# Patient Record
Sex: Male | Born: 1950
Health system: Southern US, Community
[De-identification: ages and names within clinical notes are randomized; demographics above are authoritative.]

## PROBLEM LIST (undated history)

## (undated) DIAGNOSIS — R0602 Shortness of breath: Secondary | ICD-10-CM

## (undated) DIAGNOSIS — Z8719 Personal history of other diseases of the digestive system: Secondary | ICD-10-CM

## (undated) DIAGNOSIS — I509 Heart failure, unspecified: Secondary | ICD-10-CM

## (undated) HISTORY — DX: Personal history of other diseases of the digestive system: Z87.19

## (undated) HISTORY — PX: OTHER SURGICAL HISTORY: SHX169

## (undated) HISTORY — DX: Shortness of breath: R06.02

## (undated) HISTORY — DX: Heart failure, unspecified: I50.9

---

## 1994-04-21 ENCOUNTER — Encounter: Payer: Self-pay | Admitting: Internal Medicine

## 1999-05-06 ENCOUNTER — Emergency Department (HOSPITAL_COMMUNITY): Admission: EM | Admit: 1999-05-06 | Discharge: 1999-05-06 | Payer: Self-pay | Admitting: Emergency Medicine

## 2000-02-05 ENCOUNTER — Encounter: Payer: Self-pay | Admitting: Internal Medicine

## 2000-02-09 ENCOUNTER — Encounter: Payer: Self-pay | Admitting: Internal Medicine

## 2000-02-12 ENCOUNTER — Encounter: Payer: Self-pay | Admitting: Internal Medicine

## 2000-02-12 ENCOUNTER — Ambulatory Visit (HOSPITAL_COMMUNITY): Admission: RE | Admit: 2000-02-12 | Discharge: 2000-02-12 | Payer: Self-pay | Admitting: Internal Medicine

## 2000-03-08 ENCOUNTER — Encounter: Payer: Self-pay | Admitting: Internal Medicine

## 2000-12-21 ENCOUNTER — Encounter: Payer: Self-pay | Admitting: Emergency Medicine

## 2000-12-21 ENCOUNTER — Emergency Department (HOSPITAL_COMMUNITY): Admission: EM | Admit: 2000-12-21 | Discharge: 2000-12-21 | Payer: Self-pay | Admitting: Emergency Medicine

## 2001-08-26 ENCOUNTER — Encounter (INDEPENDENT_AMBULATORY_CARE_PROVIDER_SITE_OTHER): Payer: Self-pay | Admitting: *Deleted

## 2001-08-26 ENCOUNTER — Inpatient Hospital Stay (HOSPITAL_COMMUNITY): Admission: EM | Admit: 2001-08-26 | Discharge: 2001-08-30 | Payer: Self-pay | Admitting: Emergency Medicine

## 2001-08-26 ENCOUNTER — Encounter (INDEPENDENT_AMBULATORY_CARE_PROVIDER_SITE_OTHER): Payer: Self-pay

## 2001-08-26 ENCOUNTER — Encounter: Payer: Self-pay | Admitting: Internal Medicine

## 2001-09-25 ENCOUNTER — Encounter: Payer: Self-pay | Admitting: Internal Medicine

## 2002-02-08 ENCOUNTER — Emergency Department (HOSPITAL_COMMUNITY): Admission: EM | Admit: 2002-02-08 | Discharge: 2002-02-08 | Payer: Self-pay | Admitting: Emergency Medicine

## 2004-09-12 ENCOUNTER — Emergency Department (HOSPITAL_COMMUNITY): Admission: EM | Admit: 2004-09-12 | Discharge: 2004-09-12 | Payer: Self-pay | Admitting: Emergency Medicine

## 2006-01-03 ENCOUNTER — Emergency Department (HOSPITAL_COMMUNITY): Admission: EM | Admit: 2006-01-03 | Discharge: 2006-01-03 | Payer: Self-pay | Admitting: Emergency Medicine

## 2006-01-10 ENCOUNTER — Emergency Department (HOSPITAL_COMMUNITY): Admission: EM | Admit: 2006-01-10 | Discharge: 2006-01-10 | Payer: Self-pay | Admitting: Emergency Medicine

## 2007-08-13 ENCOUNTER — Inpatient Hospital Stay (HOSPITAL_COMMUNITY): Admission: EM | Admit: 2007-08-13 | Discharge: 2007-08-17 | Payer: Self-pay | Admitting: Emergency Medicine

## 2007-08-14 ENCOUNTER — Encounter: Payer: Self-pay | Admitting: Gastroenterology

## 2007-08-14 HISTORY — PX: ESOPHAGOGASTRODUODENOSCOPY: SHX1529

## 2007-08-17 ENCOUNTER — Encounter (INDEPENDENT_AMBULATORY_CARE_PROVIDER_SITE_OTHER): Payer: Self-pay | Admitting: *Deleted

## 2007-08-17 ENCOUNTER — Ambulatory Visit: Payer: Self-pay | Admitting: Internal Medicine

## 2007-08-24 ENCOUNTER — Ambulatory Visit: Payer: Self-pay | Admitting: Internal Medicine

## 2007-08-24 LAB — CONVERTED CEMR LAB
Basophils Absolute: 0 10*3/uL (ref 0.0–0.1)
Basophils Relative: 0 % (ref 0.0–1.0)
Eosinophils Absolute: 0.2 10*3/uL (ref 0.0–0.6)
Eosinophils Relative: 2.2 % (ref 0.0–5.0)
MCHC: 33.1 g/dL (ref 30.0–36.0)
MCV: 83.4 fL (ref 78.0–100.0)
Neutro Abs: 6.6 10*3/uL (ref 1.4–7.7)
Platelets: 960 10*3/uL — ABNORMAL HIGH (ref 150–400)
RDW: 13.9 % (ref 11.5–14.6)

## 2008-03-14 DIAGNOSIS — D219 Benign neoplasm of connective and other soft tissue, unspecified: Secondary | ICD-10-CM

## 2008-03-14 DIAGNOSIS — K280 Acute gastrojejunal ulcer with hemorrhage: Secondary | ICD-10-CM | POA: Insufficient documentation

## 2008-03-14 DIAGNOSIS — Q8500 Neurofibromatosis, unspecified: Secondary | ICD-10-CM | POA: Insufficient documentation

## 2008-03-14 DIAGNOSIS — D1330 Benign neoplasm of unspecified part of small intestine: Secondary | ICD-10-CM | POA: Insufficient documentation

## 2008-03-14 DIAGNOSIS — K922 Gastrointestinal hemorrhage, unspecified: Secondary | ICD-10-CM | POA: Insufficient documentation

## 2008-03-14 DIAGNOSIS — D509 Iron deficiency anemia, unspecified: Secondary | ICD-10-CM

## 2008-04-11 ENCOUNTER — Encounter: Admission: RE | Admit: 2008-04-11 | Discharge: 2008-04-11 | Payer: Self-pay | Admitting: Internal Medicine

## 2009-06-12 ENCOUNTER — Encounter (INDEPENDENT_AMBULATORY_CARE_PROVIDER_SITE_OTHER): Payer: Self-pay | Admitting: *Deleted

## 2009-06-12 ENCOUNTER — Ambulatory Visit: Payer: Self-pay | Admitting: Sports Medicine

## 2009-06-12 DIAGNOSIS — M67919 Unspecified disorder of synovium and tendon, unspecified shoulder: Secondary | ICD-10-CM | POA: Insufficient documentation

## 2009-06-12 DIAGNOSIS — M719 Bursopathy, unspecified: Secondary | ICD-10-CM

## 2009-06-12 DIAGNOSIS — M7520 Bicipital tendinitis, unspecified shoulder: Secondary | ICD-10-CM

## 2009-06-17 ENCOUNTER — Encounter: Payer: Self-pay | Admitting: Sports Medicine

## 2009-06-26 ENCOUNTER — Encounter: Admission: RE | Admit: 2009-06-26 | Discharge: 2009-08-08 | Payer: Self-pay | Admitting: Sports Medicine

## 2009-07-09 ENCOUNTER — Encounter: Payer: Self-pay | Admitting: Sports Medicine

## 2009-08-13 ENCOUNTER — Encounter: Payer: Self-pay | Admitting: Sports Medicine

## 2010-06-16 ENCOUNTER — Emergency Department (HOSPITAL_COMMUNITY): Admission: EM | Admit: 2010-06-16 | Discharge: 2010-06-16 | Payer: Self-pay | Admitting: Emergency Medicine

## 2010-11-27 ENCOUNTER — Emergency Department (HOSPITAL_COMMUNITY)
Admission: EM | Admit: 2010-11-27 | Discharge: 2010-11-27 | Payer: Self-pay | Source: Home / Self Care | Admitting: Family Medicine

## 2010-11-27 ENCOUNTER — Inpatient Hospital Stay (HOSPITAL_COMMUNITY)
Admission: EM | Admit: 2010-11-27 | Discharge: 2010-12-02 | Payer: Self-pay | Source: Home / Self Care | Attending: Internal Medicine | Admitting: Internal Medicine

## 2010-11-28 ENCOUNTER — Encounter (INDEPENDENT_AMBULATORY_CARE_PROVIDER_SITE_OTHER): Payer: Self-pay | Admitting: Emergency Medicine

## 2011-01-06 ENCOUNTER — Ambulatory Visit: Payer: Self-pay | Admitting: Cardiovascular Disease

## 2011-02-09 ENCOUNTER — Encounter: Payer: Self-pay | Admitting: Gastroenterology

## 2011-02-09 ENCOUNTER — Inpatient Hospital Stay (HOSPITAL_COMMUNITY)
Admission: EM | Admit: 2011-02-09 | Discharge: 2011-02-12 | DRG: 092 | Disposition: A | Discharge status: Home / Self Care | Payer: 59 | Attending: Infectious Disease | Admitting: Infectious Disease

## 2011-02-09 DIAGNOSIS — R933 Abnormal findings on diagnostic imaging of other parts of digestive tract: Secondary | ICD-10-CM

## 2011-02-09 DIAGNOSIS — Z882 Allergy status to sulfonamides status: Secondary | ICD-10-CM

## 2011-02-09 DIAGNOSIS — Q8501 Neurofibromatosis, type 1: Principal | ICD-10-CM | POA: Diagnosis present

## 2011-02-09 DIAGNOSIS — E785 Hyperlipidemia, unspecified: Secondary | ICD-10-CM | POA: Diagnosis present

## 2011-02-09 DIAGNOSIS — K922 Gastrointestinal hemorrhage, unspecified: Secondary | ICD-10-CM

## 2011-02-09 DIAGNOSIS — D473 Essential (hemorrhagic) thrombocythemia: Secondary | ICD-10-CM | POA: Diagnosis present

## 2011-02-09 DIAGNOSIS — D72829 Elevated white blood cell count, unspecified: Secondary | ICD-10-CM | POA: Diagnosis present

## 2011-02-09 DIAGNOSIS — D1330 Benign neoplasm of unspecified part of small intestine: Secondary | ICD-10-CM

## 2011-02-09 DIAGNOSIS — I428 Other cardiomyopathies: Secondary | ICD-10-CM | POA: Diagnosis present

## 2011-02-09 LAB — CARDIAC PANEL(CRET KIN+CKTOT+MB+TROPI)
CK, MB: 1.2 ng/mL (ref 0.3–4.0)
Total CK: 27 U/L (ref 7–232)
Troponin I: 0.02 ng/mL (ref 0.00–0.06)

## 2011-02-09 LAB — CBC
HCT: 35.6 % — ABNORMAL LOW (ref 39.0–52.0)
Hemoglobin: 12.2 g/dL — ABNORMAL LOW (ref 13.0–17.0)
MCH: 28.2 pg (ref 26.0–34.0)
MCHC: 34.3 g/dL (ref 30.0–36.0)
MCHC: 33.2 g/dL (ref 30.0–36.0)
Platelets: 451 10*3/uL — ABNORMAL HIGH (ref 150–400)
Platelets: 427 10*3/uL — ABNORMAL HIGH (ref 150–400)
RDW: 15.1 % (ref 11.5–15.5)

## 2011-02-09 LAB — COMPREHENSIVE METABOLIC PANEL
AST: 29 U/L (ref 0–37)
Calcium: 8.6 mg/dL (ref 8.4–10.5)
Chloride: 111 mEq/L (ref 96–112)
GFR calc Af Amer: >60 mL/min (ref >60)
Sodium: 142 mEq/L (ref 135–145)
Total Bilirubin: 0.7 mg/dL (ref 0.3–1.2)

## 2011-02-09 LAB — TYPE AND SCREEN: ABO/RH(D): A POS

## 2011-02-09 LAB — POCT CARDIAC MARKERS: Myoglobin, poc: 32.2 ng/mL (ref 12–200)

## 2011-02-09 LAB — PROTIME-INR: INR: 0.95 (ref 0.00–1.49)

## 2011-02-09 LAB — DIFFERENTIAL
Basophils Relative: 1 % (ref 0–1)
Eosinophils Relative: 1 % (ref 0–5)
Lymphocytes Relative: 18 % (ref 12–46)
Monocytes Relative: 12 % (ref 3–12)
Neutro Abs: 10.3 10*3/uL — ABNORMAL HIGH (ref 1.7–7.7)

## 2011-02-09 LAB — MRSA PCR SCREENING: MRSA by PCR: NEGATIVE

## 2011-02-09 LAB — TROPONIN I: Troponin I: 0.04 ng/mL (ref 0.00–0.06)

## 2011-02-10 DIAGNOSIS — R933 Abnormal findings on diagnostic imaging of other parts of digestive tract: Secondary | ICD-10-CM

## 2011-02-10 DIAGNOSIS — K921 Melena: Secondary | ICD-10-CM

## 2011-02-10 DIAGNOSIS — D1330 Benign neoplasm of unspecified part of small intestine: Secondary | ICD-10-CM

## 2011-02-10 LAB — COMPREHENSIVE METABOLIC PANEL
Albumin: 3.3 g/dL — ABNORMAL LOW (ref 3.5–5.2)
BUN: 16 mg/dL (ref 6–23)
Calcium: 8.4 mg/dL (ref 8.4–10.5)
Creatinine, Ser: 0.8 mg/dL (ref 0.4–1.5)
GFR calc Af Amer: >60 mL/min (ref >60)
Sodium: 141 mEq/L (ref 135–145)
Total Bilirubin: 0.9 mg/dL (ref 0.3–1.2)
Total Protein: 5.8 g/dL — ABNORMAL LOW (ref 6.0–8.3)

## 2011-02-10 LAB — CBC
HCT: 31.6 % — ABNORMAL LOW (ref 39.0–52.0)
HCT: 33 % — ABNORMAL LOW (ref 39.0–52.0)
Hemoglobin: 10.5 g/dL — ABNORMAL LOW (ref 13.0–17.0)
MCH: 27.1 pg (ref 26.0–34.0)
Platelets: 421 10*3/uL — ABNORMAL HIGH (ref 150–400)
RBC: 3.81 MIL/uL — ABNORMAL LOW (ref 4.22–5.81)
RBC: 4.02 MIL/uL — ABNORMAL LOW (ref 4.22–5.81)
WBC: 21.1 10*3/uL — ABNORMAL HIGH (ref 4.0–10.5)
WBC: 21.3 10*3/uL — ABNORMAL HIGH (ref 4.0–10.5)

## 2011-02-11 ENCOUNTER — Encounter (INDEPENDENT_AMBULATORY_CARE_PROVIDER_SITE_OTHER): Payer: Self-pay

## 2011-02-11 ENCOUNTER — Encounter: Payer: Self-pay | Admitting: Internal Medicine

## 2011-02-11 DIAGNOSIS — D1330 Benign neoplasm of unspecified part of small intestine: Secondary | ICD-10-CM

## 2011-02-11 DIAGNOSIS — R933 Abnormal findings on diagnostic imaging of other parts of digestive tract: Secondary | ICD-10-CM

## 2011-02-11 DIAGNOSIS — K921 Melena: Secondary | ICD-10-CM

## 2011-02-11 LAB — CBC
Hemoglobin: 10.4 g/dL — ABNORMAL LOW (ref 13.0–17.0)
MCH: 27.9 pg (ref 26.0–34.0)
MCHC: 34 g/dL (ref 30.0–36.0)
RDW: 14.8 % (ref 11.5–15.5)

## 2011-02-12 DIAGNOSIS — K922 Gastrointestinal hemorrhage, unspecified: Secondary | ICD-10-CM

## 2011-02-12 LAB — CBC
MCH: 27.9 pg (ref 26.0–34.0)
RDW: 14.5 % (ref 11.5–15.5)

## 2011-02-16 NOTE — Procedures (Signed)
Summary: Upper Endoscopy  Patient: [name] Note: All result statuses are Final unless otherwise noted.  Tests: (1) Upper Endoscopy (EGD)   EGD Upper Endoscopy       DONE     [facility]     [address]     [place], [place]  [address]           ENDOSCOPY PROCEDURE REPORT           PATIENT:  Mike [name], Mike [name]  MR#:  [id number]     BIRTHDATE:  Dec 06, 1951, 59 yrs. 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  The EG-2990i ([id number]) endoscope was     introduced through the mouth and advanced to the third portion of     the duodenum, without limitations.  The instrument was slowly     withdrawn as the mucosa was fully examined.     <<PROCEDUREIMAGES>>     There were four submucosal nodules in duodenum. These ranged in     size from 1-2.5cm. The largest, located in second duodenum was     ulcerated and oozing blood slowly. This was treated with injection     of 5 cc of dilute epinephrine and then bicap cautery. After     treatment, the base of the nodule was "tatooed" with injectin of     [place] Ink (see image006, image007, image010, and image011).     Otherwise the examination was normal (see image002 and image001).     Retroflexed views revealed no abnormalities.    The scope was then     withdrawn from the patient and the procedure completed.     COMPLICATIONS:  None           ENDOSCOPIC IMPRESSION:     1) Four submucosal nodules in duodenum, the largest was 2.5cm     (ulcerated  and oozing) and it was treated with injection of dilute     epinephrine and cautery.  This lesion was similar to that noted by     Dr. Russella Dar in 2008, but larger.     2) Otherwise normal examination           RECOMMENDATIONS:     Follow clinically for rebleeding. 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 There were 4     of these submucosal lesions in duodenum and could be many more in     more distal small bowel.     If bleeding continues, he may require surgical resection (site     of largest nodule was labled with [place] Ink).           ______________________________     Rachael Fee, MD  n.     eSIGNED:   Rachael Fee at 02/09/2011 02:48 PM           Karna Dupes, [id number]  Note: An exclamation mark (!) indicates a result that was not dispersed into the flowsheet. Document Creation Date: 02/09/2011 2:49 PM _______________________________________________________________________  (1) Order result status: Final Collection or observation date-time: 02/09/2011 14:36 Requested date-time:  Receipt date-time:  Reported date-time:  Referring Physician:   Ordering Physician: Rob Bunting [phone]) Specimen Source:  Source: Launa Grill Order Number: [id number] Lab site:

## 2011-02-16 NOTE — Letter (Signed)
Summary: Appt Reminder 2  [facility]  [address]   [place], [place] [address]   Phone: [phone]  Fax: [phone]        February 11, 2011 MRN: [id number]    KAYNEN MINNER [address] [place], [place]  [address]    Dear Mr. Margaretmary Dys,   You have a return appointment with Dr. Marina Goodell on 02/23/11 at 2pm.  Please remember to bring a complete list of the medicines you are taking, your insurance card and your co-pay.  If you have to cancel or reschedule this appointment, please call before 5:00 pm the evening before to avoid a cancellation fee.  If you have any questions or concerns, please call [phone].    Sincerely,    Selinda Michaels RN  Appended Document: Appt Reminder 2 Letter is mailed to the patient's home address

## 2011-02-16 NOTE — Miscellaneous (Signed)
Summary: F/U appt  Clinical Lists Changes Patient scheduled for hospital f/u for 02/23/11@2pm  with Dr. Marina Goodell. Spoke with patient nurse at the hospital. She will give him the appointment date and time.

## 2011-02-23 ENCOUNTER — Ambulatory Visit (INDEPENDENT_AMBULATORY_CARE_PROVIDER_SITE_OTHER): Payer: Commercial Managed Care - PPO | Admitting: Internal Medicine

## 2011-02-23 ENCOUNTER — Encounter: Payer: Self-pay | Admitting: Internal Medicine

## 2011-02-23 DIAGNOSIS — D1330 Benign neoplasm of unspecified part of small intestine: Secondary | ICD-10-CM

## 2011-02-23 DIAGNOSIS — D62 Acute posthemorrhagic anemia: Secondary | ICD-10-CM

## 2011-02-23 DIAGNOSIS — K921 Melena: Secondary | ICD-10-CM

## 2011-02-25 NOTE — Discharge Summary (Signed)
Summary: Discharge Summary  NAME:  RIGBY, LEONHARDT NO.:  [id number]      MEDICAL RECORD NO.:  [id number]          PATIENT TYPE:  INP      LOCATION:  5004                         FACILITY:  [facility]      PHYSICIAN:  Judie Petit T. Russella Dar, MD, FACGDATE OF BIRTH:  [birth year]      DATE OF ADMISSION:  08/13/2007   DATE OF DISCHARGE:  08/17/2007                                  DISCHARGE SUMMARY      ADMITTING DIAGNOSES:   88. A 60 year old white male with acute gastrointestinal bleed in the       setting of known neurofibromas of the small bowel, suspect small       bowel source for current bleed.   2. 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   3. Status post remote splenectomy, traumatic.   4. Neurofibromatosis.      DISCHARGE DIAGNOSES:   1. Stable, status post acute upper gastrointestinal bleed secondary to       ulcerated duodenal neurofibroma.   2. Anemia, acute, blood loss secondary to above.   3. Other diagnoses as listed above.      CONSULTATIONS:  None.      PROCEDURES:  Upper endoscopy per Dr. Russella Dar on August 14, 2007 with   epinephrine and bipolar electrocoagulation therapy.      BRIEF HISTORY:  Mr. Skibinski is a very nice 60 year old white male known   to [facility], with history of neurofibromatosis.  He has a prior   history of GI bleeding remotely in 1986 and then had an episode for   which he was hospitalized approximately 6 years ago.  At that time, he   had source identified in the duodenum felt secondary to a 2-cm   neurofibroma.  His bleeding stopped without any intervention and he has   not had any bleeding in interval years.  At this time, he presents to   the emergency room after onset the morning of admission with black   stools at home x1.  He has no associated abdominal pain, no weakness,   dizziness, nausea, vomiting, etc.  He denies any regular aspirin or   NSAID use, no new medications.  He was  hemodynamically stable in the   emergency room, but did have melena on rectal exam and a hemoglobin of   10.8.  He was admitted for supportive medical management.      LABORATORY STUDIES:  Again on admission, WBC of 11.6, hemoglobin 10.8,   hematocrit of 32.7, MCV of 86.  Serial values were obtained.  On the   24th, hemoglobin dropped to 9.6, hematocrit of 28; on the 26th, stable   at 9.7 and 28.6 and on the 28th, hemoglobin 9.7 and hematocrit of 28.6.   Pro time 12.2, INR of 0.9.  Electrolytes within normal limits.  BUN was   14, creatinine 0.6 on admission.  Albumin 3.4.  Liver function studies   normal.  H. pylori antibody was done and is still pending.  HOSPITAL COURSE:  The patient was admitted to the service of Dr. Stan Head, who was covering on-call.  He was placed on IV fluids and   b.i.d. IV Protonix.  Serial H&H were obtained.  Fortunately, he had a   very stable course, did not require any blood transfusions.  He   underwent upper endoscopy with Dr. Russella Dar on the morning of 25th and was   found to have an ulceration on the surface of a neurofibroma in the   second portion of duodenum; this was oozing.  It was injected with   epinephrine and then cauterized.  He had 2 other neurofibromas noted in   that same region of the duodenum.  The ulceration on was located on the   distal aspect of the neurofibroma and was visualized and treated with   the duodenoscope as it was not visualized with the standard forward   viewing endoscope. He was placed on an IV infusion of Protonix.  His   diet was gradually advanced.  He did not have any further evidence of   active bleeding.  His hemoglobin remained stable in the 9.7 range and on   the 28th, he was allowed discharge to home with instructions to follow   up with Dr. Marina Goodell in the office on September 4 and at that time, will   need followup H&H.  He also will be discharged on b.i.d. Protonix for 2   weeks and then daily  thereafter, was advised again no aspirin or NSAIDs.   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               Amy Esterwood, PA-C               Malcolm T. Russella Dar, MD, [facility]   Electronically Signed         AE/MEDQ  D:  08/17/2007  T:  08/18/2007  Job:  [id number]

## 2011-02-25 NOTE — Discharge Summary (Signed)
Summary: discharge summary                    [place]  Patient:    Mike [name], Mike [name] Visit Number: [id number] MRN: [id number]          Service Type: MED Location: 5000 [id number] Attending Physician:  Mervin Hack Dictated by:   Dianah Field, P.A. Admit Date:  08/26/2001 Disc. Date: 08/30/01   CC:         [name]. [name]., M.D. [facility] T. Hoxworth, M.D.   Discharge Summary  ADMISSION DIAGNOSES: 1. Upper gastrointestinal bleed secondary to small bowel neurofibroma.    Possibly precipitated by aspirin use. 2. Anemia, acute.  Possibly chronic as well. 3. History of iron-deficiency anemia, on chronic supplementation. 4. Neurofibromatosis with upper intestinal bleeds in the 1980s, 1995, and    heme-positive anemia in February 2001. 5. 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 6. SULFA allergy.  DISCHARGE DIAGNOSES: 1. 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 2. Status post EGD with injection of epinephrine per Dr. Lina Sar on    August 26, 2001. 3. Acute on chronic anemia.  Chronic anemia despite iron supplementation.    Acute anemia secondary to the acute gastrointestinal blood losses.  The    patient was transfused with 2 units of packed red blood cells during this    admission. 4. Neurofibromatosis. 5. Mild cardiomegaly by chest x-ray, no evidence for hypertension during this    admission but needs outpatient follow-up to rule this out.  CONSULTATIONS:  Dr. Glenna Fellows, general surgery.  PROCEDURES:  Upper endoscopy by Dr. Juanda Chance with findings of duodenal mass felt to be neurofibroma containing an ulcerated bleeding area.  BRIEF HISTORY:  Mr. Mike [name] is a pleasant 60 year old white male.  Generally, he has no health problems other than a history of neurofibromatosis and GI bleeds associated with duodenal neurofibromas.  He had had a GI bleed in the 1980s when he was in the  service in [place].  In 1995, he was hospitalized by Dr. Marina Goodell for upper GI bleed and at this time he had an upper endoscopy, colonoscopy, and a small bowel enteroclysis.  What Dr. Marina Goodell found on the endoscopy was multiple neurofibromas within the duodenum.  Although these were not bleeding at the time, they were felt to be the source for the GI bleed. Again, in February 2001, he was seen at Dr. Broadus John office because of anemia and Hemoccult positive stool but no overt bleeding.  Endoscopy and colonoscopy were repeated at that time, and again the colonoscopy to the terminal ileum was normal and upper endoscopy including push enteroscopy showed a large submucosal mass in the third portion of the duodenum, which measured at least 4 cm, and a small, approximately 8 mm, submucosal mass in the second duodenum. There was superficial ulceration of this larger mass.  He did reach the proximal jejunum without seeing any further neurofibromas.  Incidentally, he found esophagitis and a large caliber benign-appearing stricture and a sliding hiatal hernia.  Since February 2001, the patient has done pretty well and his hemoglobin in March 2001 was 15.7.  He had been started on iron supplementation.  The patient presented to the emergency room at [facility] on September 7 with a history of one melanic black stool on the morning of admission and a darker but nonmelanic stool on the day prior.  He had some associated weakness and sweating with  a bowel movement but no abdominal pain, nausea, vomiting, or hematemesis.  He did say that, because of some achy soreness, he had been using Aleve over the past several days but had not been using any aspirin.  He was not aware that Aleve could exacerbate or trigger gastrointestinal bleeding.  In the emergency room, his hemoglobin was 10.2. Blood pressure and pulse were stable and he was in no acute distress.  He was admitted by Dr. Lina Sar and underwent upper  endoscopy on the day of admission.  Dr. Juanda Chance did not have access to the patients records and she thought that what she saw was a neurofibroma, within which was a bleeding ulcerated area, but she was not certain whether this might not be an enlarged biliary papula; however, on review of the patients office records, it was fairly clear that this was again a neurofibroma that was bleeding.  LABORATORY DATA:  Hemoglobin on admission was 10.2.  Later, on hospital day #1, it was 8.2.  At discharge, hemoglobin was 8.5.  Hematocrit initially was 30, down to 24.1, and final assay was 25.  White blood cell count up to a maximum of 17.7.  It was 13.9 near discharge.  MCV was 82.8.  Platelets ranging from 272 up to 449.  PT was 13.2, INR 1.0, PTT 26.  IV bleeding time was elevated at 13.5 minutes, the reference range is 2-8.5 minutes.  Sodium 136, potassium 4.3, chloride 110.  Glucose was anywhere from 113 up to 121. BUN was initially elevated at 35 but normalized to 13.  Creatinine was 0.8. Calcium slightly low at 7.8.  Albumin 3.3.  Total bilirubin 0.4, alkaline phosphatase 53, AST 17, ALT 18.  Iron was 36, total iron binding capacity 312, iron saturation was 12, and ferritin was 11.  Urinalysis was negative.  September 7 acute abdominal series with PA chest views showed some mild cardiomegaly.  There were multiple minimally prominent loops of small bowel representing a nonspecific bowel gas pattern.  HOSPITAL COURSE:  The patient underwent upper endoscopy by Dr. Juanda Chance.  He was admitted initially to an ICU bed.  By September 8, hospital day #2, he had received 2 units of packed red blood cells.  Hemodynamically, he had been stable overnight.  The elevated BUN was normalizing.  He had had no further stools.  He was transferred out of ICU to a nonmonitored bed on unit 5000.  Over the next few days of his hospitalization, the patients blood counts were tracked and these dropped further down to a  low of hemoglobin of 8 but  decision was made not to transfuse him any further, as he was not having any symptomatic repercussions from the anemia, such as dizziness.  He was a bit tired but was not profoundly weak.  His blood pressure was well controlled. At times, he got mildly tachycardic into the low 100s.  Because of the recurrent nature of the patients GI bleeds, Dr. Johna Sheriff was called in to see the patient.  Dr. Johna Sheriff has seen this patient a few years back.  His opinion was to watch the patient off the nonsteroidals.  He did feel that, if the patient had recurrent bleeding, that surgery would probably be the patients best option but for this hospitalization, if he did not have any further bleeding, that surgery was unnecessary.  It turns out that the patient did not have any further acute bleeding.  He passed some small, dark bowel movements but had no large-volume  melena. Hemoglobin stabilized and the patient clinically was able to walk down to the cafeteria without difficulty.  He was felt stable and improved enough for discharge on September 11.  FOLLOW-UP:  He is to see Dr. Marina Goodell in the office within the next 2-4 weeks. At that time, his blood counts can be rechecked.  In the meantime, the patient is able to get the emergency room staff to check his blood counts if he wishes or feels necessary.  He is to call Dr. Broadus John office if he has any further bleeding or symptomatic anemia such as tachycardia, weakness, nausea, or recurrent melena.  DISCHARGE MEDICATIONS:  Iron sulfate 325 mg 1 p.o. t.i.d.  He was again advised not to use any aspirin, Aleve, ibuprofen, etc.  If he had any questions about whether or not a medication could cause GI bleeding, he was to consult a pharmacist or physician.  He was also advised to use stool softener or Milk of Magnesia if needed for iron-related constipation.  RETURN TO WORK:  The patient was okay to return to work on September 23.   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  DIET AT DISCHARGE:  Unrestricted.  The patient does not have a primary care physician and he was advised to establish care with a primary care physician of his choice.  He needs regular medical management.  The chest x-ray had shown some cardiomegaly, which raises question of whether or not the patient has some underlying hypertension and he should have regular general medical care. Dictated by:   Dianah Field, P.A. Attending Physician:  Mervin Hack DD:  08/30/01 TD:  08/30/01 Job: [id number]

## 2011-02-25 NOTE — Procedures (Signed)
Summary: EGD-Dr. Juanda Chance   EGD  Procedure date:  08/26/2001  Findings:      Location: [facility]  Findings: Ulcer, bleeding  duodenal mass                    [place]  Patient:    Mike [name], Mike [name] Visit Number: [id number] MRN: [id number]          Service Type: MED Location: MICU 2115 01 Attending Physician:  Mervin Hack Dictated by:   Hedwig Morton. Juanda Chance, M.D. [facility] Proc. Date: 08/26/01 Admit Date:  08/26/2001   CC:         [name]. [name]., M.D. [facility]   Procedure Report  PROCEDURE: Upper endoscopy.  ENDOSCOPIST: Hedwig Morton. Juanda Chance, M.D.  INDICATIONS FOR PROCEDURE: This is a 60 year old gentleman, with neurofibromatosis and chronic intermittent GI bleed, who presented with melenic stools of 24 hours duration.  Hemoglobin 10.2 g while taking iron. His hemoglobin was 13.5 within the last four weeks.  He denies any abdominal pain.  He has been on Aleve two tablets q.d.  He had previous endoscopy and colonoscopy on three previous occasions, the last one approximately two years ago.  His BUN was elevated at 35.  His stool was melenic and Hemoccult positive.  He is undergoing upper endoscopy to assess him for upper GI bleed. Actually the pediatric colonoscopy was used for enteroscopy in expectation of looking for AV malformations.  ENDOSCOPE: Olympus single-channel pediatric colonoscope.  SEDATION: Versed 10 mg IV, Demerol 100 mg IV.  FINDINGS: The Fujinon single-channel video endoscope was passed under direct vision through the posterior pharynx and esophagus.  The patient was monitored by pulse oximetry and oxygen saturations were normal.  Proximal and distal esophageal mucosa was unremarkable.  There was a small hiatal hernia which was easily reducible, and normal Z-line.  There was no blood in the stomach or esophagus.  Stomach - The stomach was insufflated with air and showed normal pyloric outlet, gastric folds, and lesser curvature  of the stomach.  A few coffee-ground specks were refluxing from the duodenum.  Duodenum - The duodenal bulb was normal.  Papilla was visualized in the second portion of the duodenum.  Distal to the papilla about 3 cm distally was a large polypoid mass which was smooth with large ulceration measuring at least 1 cm in diameter, with an ulcerative center which was oozing low-grade bleed. No arterial spurt.  It was not clear whether this represented neurofibroma or whether this was actually a major papilla which was enlarged.  The descending duodenum and through the ligament of Treitz and distally was entirely normal. The endoscope was then retracted and the bleeding lesion injected with 1 cc of 1:200,000 epinephrine, which seemed to increase the bleeding for about a minute and the bleeding then stopped again.  Because of the uncertainty as to whether this represented major papilla or a neurofibroma no biopsies or further injections were carried out.  The patient tolerated the procedure well.  IMPRESSION: Duodenal mass with bleeding ulcer, ulcerated area; rule out neurofibroma versus enlarged biliary papilla.  PLAN: The patient will be observed in the intensive care unit and transfused as needed.  We will review his old records as to the results of previous endoscopy and findings of the mass, which will have to be further evaluated when the patient stops bleeding.  We will likely proceed with CT scan of the abdomen, liver function tests, and possible biopsy of  the lesion. Dictated by:   Hedwig Morton. Juanda Chance, M.D. [facility] Attending Physician:  Mervin Hack DD:  08/26/01 TD:  08/27/01 Job: [id number]  This report was created from the original endoscopy report, which was reviewed and signed by the above listed endoscopist.   cc:  Yancey Flemings, MD

## 2011-03-02 LAB — COMPREHENSIVE METABOLIC PANEL
ALT: 29 U/L (ref 0–53)
AST: 15 U/L (ref 0–37)
Albumin: 3.2 g/dL — ABNORMAL LOW (ref 3.5–5.2)
Albumin: 3.3 g/dL — ABNORMAL LOW (ref 3.5–5.2)
Albumin: 3.5 g/dL (ref 3.5–5.2)
Alkaline Phosphatase: 72 U/L (ref 39–117)
BUN: 11 mg/dL (ref 6–23)
BUN: 12 mg/dL (ref 6–23)
Calcium: 8.5 mg/dL (ref 8.4–10.5)
Chloride: 104 mEq/L (ref 96–112)
Chloride: 106 mEq/L (ref 96–112)
Creatinine, Ser: 0.96 mg/dL (ref 0.4–1.5)
Creatinine, Ser: 1.02 mg/dL (ref 0.4–1.5)
GFR calc Af Amer: >60 mL/min (ref >60)
Glucose, Bld: 105 mg/dL — ABNORMAL HIGH (ref 70–99)
Potassium: 3.6 mEq/L (ref 3.5–5.1)
Sodium: 138 mEq/L (ref 135–145)
Total Bilirubin: 0.8 mg/dL (ref 0.3–1.2)
Total Bilirubin: 1.3 mg/dL — ABNORMAL HIGH (ref 0.3–1.2)
Total Protein: 6.5 g/dL (ref 6.0–8.3)
Total Protein: 6.6 g/dL (ref 6.0–8.3)

## 2011-03-02 LAB — URINALYSIS, ROUTINE W REFLEX MICROSCOPIC
Bilirubin Urine: NEGATIVE
Nitrite: NEGATIVE

## 2011-03-02 LAB — POCT I-STAT 3, ART BLOOD GAS (G3+)
Acid-base deficit: 2 mmol/L (ref 0.0–2.0)
pO2, Arterial: 55 mmHg — ABNORMAL LOW (ref 80.0–100.0)

## 2011-03-02 LAB — CARDIAC PANEL(CRET KIN+CKTOT+MB+TROPI)
Relative Index: INVALID (ref 0.0–2.5)
Relative Index: INVALID (ref 0.0–2.5)
Relative Index: INVALID (ref 0.0–2.5)
Relative Index: INVALID (ref 0.0–2.5)
Total CK: 38 U/L (ref 7–232)
Total CK: 43 U/L (ref 7–232)
Troponin I: 0.02 ng/mL (ref 0.00–0.06)
Troponin I: 0.02 ng/mL (ref 0.00–0.06)
Troponin I: 0.02 ng/mL (ref 0.00–0.06)

## 2011-03-02 LAB — LIPID PANEL
Cholesterol: 212 mg/dL — ABNORMAL HIGH (ref 0–200)
Total CHOL/HDL Ratio: 3.2 RATIO
VLDL: 8 mg/dL (ref 0–40)

## 2011-03-02 LAB — URINE MICROSCOPIC-ADD ON

## 2011-03-02 LAB — POCT I-STAT 3, VENOUS BLOOD GAS (G3P V)
Acid-base deficit: 2 mmol/L (ref 0.0–2.0)
O2 Saturation: 58 %
TCO2: 24 mmol/L (ref 0–100)
pH, Ven: 7.362 — ABNORMAL HIGH (ref 7.250–7.300)

## 2011-03-02 LAB — CBC
HCT: 42.6 % (ref 39.0–52.0)
HCT: 44 % (ref 39.0–52.0)
HCT: 45.4 % (ref 39.0–52.0)
Hemoglobin: 14.3 g/dL (ref 13.0–17.0)
Hemoglobin: 14.8 g/dL (ref 13.0–17.0)
MCH: 27.5 pg (ref 26.0–34.0)
MCH: 28.1 pg (ref 26.0–34.0)
MCH: 28.2 pg (ref 26.0–34.0)
MCHC: 33.5 g/dL (ref 30.0–36.0)
MCHC: 33.8 g/dL (ref 30.0–36.0)
MCHC: 34.1 g/dL (ref 30.0–36.0)
MCV: 83.4 fL (ref 78.0–100.0)
MCV: 83.4 fL (ref 78.0–100.0)
MCV: 84.5 fL (ref 78.0–100.0)
Platelets: 472 10*3/uL — ABNORMAL HIGH (ref 150–400)
Platelets: 479 10*3/uL — ABNORMAL HIGH (ref 150–400)
Platelets: 507 10*3/uL — ABNORMAL HIGH (ref 150–400)
RBC: 5.24 MIL/uL (ref 4.22–5.81)
RBC: 5.38 MIL/uL (ref 4.22–5.81)
RDW: 13.7 % (ref 11.5–15.5)
RDW: 13.7 % (ref 11.5–15.5)
RDW: 13.8 % (ref 11.5–15.5)
RDW: 13.9 % (ref 11.5–15.5)
RDW: 13.9 % (ref 11.5–15.5)
WBC: 11 10*3/uL — ABNORMAL HIGH (ref 4.0–10.5)
WBC: 12.5 10*3/uL — ABNORMAL HIGH (ref 4.0–10.5)
WBC: 9.2 10*3/uL (ref 4.0–10.5)

## 2011-03-02 LAB — BASIC METABOLIC PANEL
BUN: 13 mg/dL (ref 6–23)
CO2: 23 mEq/L (ref 19–32)
Calcium: 9 mg/dL (ref 8.4–10.5)
Chloride: 107 mEq/L (ref 96–112)
Chloride: 108 mEq/L (ref 96–112)
Creatinine, Ser: 0.89 mg/dL (ref 0.4–1.5)
GFR calc Af Amer: >60 mL/min (ref >60)
Glucose, Bld: 102 mg/dL — ABNORMAL HIGH (ref 70–99)
Potassium: 3.7 mEq/L (ref 3.5–5.1)
Sodium: 139 mEq/L (ref 135–145)
Sodium: 140 mEq/L (ref 135–145)

## 2011-03-02 LAB — DIFFERENTIAL
Basophils Absolute: 0.1 10*3/uL (ref 0.0–0.1)
Eosinophils Relative: 4 % (ref 0–5)
Lymphocytes Relative: 27 % (ref 12–46)
Lymphocytes Relative: 33 % (ref 12–46)
Lymphs Abs: 3 10*3/uL (ref 0.7–4.0)
Monocytes Absolute: 1.7 10*3/uL — ABNORMAL HIGH (ref 0.1–1.0)
Monocytes Relative: 14 % — ABNORMAL HIGH (ref 3–12)
Monocytes Relative: 19 % — ABNORMAL HIGH (ref 3–12)
Neutrophils Relative %: 57 % (ref 43–77)

## 2011-03-02 LAB — D-DIMER, QUANTITATIVE: D-Dimer, Quant: <0.22 ug/mL-FEU (ref 0.00–0.48)

## 2011-03-02 LAB — HIV ANTIBODY (ROUTINE TESTING W REFLEX): HIV: NONREACTIVE

## 2011-03-02 LAB — IRON: Iron: 78 ug/dL (ref 42–135)

## 2011-03-02 LAB — HEPATITIS PANEL, ACUTE
Hep B C IgM: NEGATIVE
Hepatitis B Surface Ag: NEGATIVE

## 2011-03-02 LAB — IRON AND TIBC
Iron: 51 ug/dL (ref 42–135)
UIBC: 272 ug/dL

## 2011-03-02 LAB — POCT CARDIAC MARKERS
CKMB, poc: 1.2 ng/mL (ref 1.0–8.0)
Myoglobin, poc: 61.3 ng/mL (ref 12–200)
Troponin i, poc: 0.07 ng/mL (ref 0.00–0.09)

## 2011-03-02 LAB — TROPONIN I: Troponin I: 0.03 ng/mL (ref 0.00–0.06)

## 2011-03-02 LAB — PROTIME-INR
INR: 0.88 (ref 0.00–1.49)
Prothrombin Time: 12.1 seconds (ref 11.6–15.2)

## 2011-03-02 LAB — APTT: aPTT: 23 seconds — ABNORMAL LOW (ref 24–37)

## 2011-03-02 LAB — CK TOTAL AND CKMB (NOT AT ARMC): Relative Index: INVALID (ref 0.0–2.5)

## 2011-03-02 LAB — FERRITIN: Ferritin: 66 ng/mL (ref 22–322)

## 2011-03-02 NOTE — Procedures (Signed)
Summary: EGD/[facility]  EGD/[facility]   Imported By: Sherian Rein 02/24/2011 08:31:24  _____________________________________________________________________  External Attachment:    Type:   Image     Comment:   External Document

## 2011-03-02 NOTE — Letter (Signed)
Summary: Return to Work  [facility]  [address] [place], [place] [address]   Phone: [phone]  Fax: [phone]           02/23/2011  TO: Leodis Sias IT MAY CONCERN  RE: Mike [name] [address] [place]   The above named individual is under my medical care and may return to work on: Monday, 03/01/11    If you have any further questions or need additional information, please call.     Sincerely,    [name]. Marina Goodell, MD

## 2011-03-02 NOTE — Procedures (Signed)
Summary: EGD/[facility]  EGD/[facility]   Imported By: Sherian Rein 02/24/2011 08:29:21  _____________________________________________________________________  External Attachment:    Type:   Image     Comment:   External Document

## 2011-03-02 NOTE — Assessment & Plan Note (Signed)
Summary: F/U Hospital per Dr. Marina Goodell -  GI bleed   History of Present Illness Visit Type: Follow-up Visit Primary GI MD: Yancey Flemings MD Primary Provider: n/a Chief Complaint: Patient here for f/u after hospitalization. Endooscopy showed ulcerated, oozing nodules in duodenum. History of Present Illness:    60 year old with hypertension, hyperlipidemia, and neurofibromatosis. history of occult GI bleeding with iron deficiency anemia as well as acute GI bleeding. Hospitalized in  1995 , 2008, and most recently 2 weeks ago with acute GI bleeding secondary to ulceration of a submucosal duodenal mass. 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 required endoscopic hemostatic therapy the last 2 sessions. Previously seen by surgery for possible resection. Most recent lesion marked with [place] ink tattoo. Most recent lesion noted to be larger than on previous endoscopy. since discharge from hospital has done well without recurrent bleeding. Aside for some fatigue otherwise okay. request out of work until Monday.   GI Review of Systems      Denies abdominal pain, belching, bloating, chest pain, dysphagia with liquids, dysphagia with solids, heartburn, loss of appetite, nausea, vomiting, vomiting blood, weight loss, and  weight gain.        Denies anal fissure, black tarry stools, change in bowel habit, constipation, diarrhea, diverticulosis, fecal incontinence, heme positive stool, hemorrhoids, irritable bowel syndrome, jaundice, light color stool, liver problems, rectal bleeding, and  rectal pain. Preventive Screening-Counseling & Management  Alcohol-Tobacco     Smoking Status: never  Caffeine-Diet-Exercise     Does Patient Exercise: yes      Drug Use:  no.      Current Medications (verified): 1)  Protonix 40 Mg Tbec (Pantoprazole Sodium) .... Take 1 Tablet By Mouth Two Times A Day 2)  Ferrous Gluconate 324 (38 Fe) Mg Tabs (Ferrous Gluconate) .... Take 1 Tablet By Mouth Three Times A Day 3)   Carvedilol 6.25 Mg Tabs (Carvedilol) .... Take 1 Tablet By Mouth Two Times A Day 4)  Diovan 160 Mg Tabs (Valsartan) .... Take 1 Tablet By Mouth Once A Day 5)  Furosemide 20 Mg Tabs (Furosemide) .... Take 1 Tablet By Mouth Once A Day 6)  Lipitor 20 Mg Tabs (Atorvastatin Calcium) .... Take 1 Tablet By Mouth Once A Day  Allergies (verified): 1)  ! Sulfa  Past History:  Past Medical History: Reviewed history from 02/19/2011 and no changes required. 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  Alcohol Use - no Daily Caffeine Use-4 cups daily Illicit Drug Use - no Patient gets regular exercise. Smoking Status:  never Drug Use:  no Does Patient Exercise:  yes  Review of Systems       The patient complains of hearing problems.  The patient denies allergy/sinus, anemia, anxiety-new, arthritis/joint pain, back pain, blood in urine, breast changes/lumps, change in vision, confusion, cough, coughing up blood, depression-new, fainting, fatigue, fever, headaches-new, heart murmur, heart rhythm changes, itching, menstrual pain, muscle pains/cramps, night sweats, nosebleeds, pregnancy symptoms, shortness of breath, skin rash, sleeping problems, sore throat, swelling of feet/legs, swollen lymph glands, thirst - excessive , urination - excessive , urination changes/pain, urine leakage, vision changes, and voice change.    Vital  Signs:  Patient profile:   60 year old male Height:      72 inches Weight:      199.50 pounds BMI:      27.15 BSA:     2.13 Pulse rate:   80 / minute Pulse rhythm:   regular BP sitting:   112 / 80  (left arm)  Vitals Entered By: Lamona Curl CMA Duncan Dull) (February 23, 2011 2:05 PM)  Physical Exam  General:  Well developed, well nourished, no acute distress. Head:  Normocephalic and atraumatic. Eyes:  PERRLA, no icterus. Mouth:  No deformity or lesions, dentition normal. Neck:  Supple; no masses or thyromegaly. Lungs:  Clear throughout to auscultation. Heart:  Regular rate and rhythm; no murmurs, rubs,  or bruits. Abdomen:  Soft, nontender and nondistended. No masses, hepatosplenomegaly or hernias noted. Normal bowel sounds. Pulses:  Normal pulses noted. Extremities:  No clubbing, cyanosis, edema or deformities noted. Neurologic:  Alert and  oriented x4;  grossly normal neurologically. Skin:   multiple neurofibroma, about the body Psych:  Alert and cooperative. Normal mood and affect.   Impression & Recommendations:  Problem # 1:  GI BLEEDING (ICD-578.9)  recurrent acute GI bleeding secondary to ulcerative lesions in the second duodenum (at least 2). Most recently 2 weeks ago and requiring endoscopic hemostatic therapy.   Plan : #1. continue chronic iron therapy #2. surgical referral back to Dr. Johna Sheriff for consideration of resection of large duodenal lesions #3. out of work until Monday note provided  Other Orders: [facility] ([facility])  Patient Instructions: 1)  Appointment scheduled at [facility] with Dr. Johna Sheriff  03/12/11 2:00 pm 2)  Work note given to patient. 3)  Copy sent to : Glenna Fellows, MD 4)  The medication list was reviewed and reconciled.  All changed / newly prescribed medications were explained.  A complete medication list was provided to the patient / caregiver.

## 2011-03-02 NOTE — Progress Notes (Signed)
Summary: [facility]   Imported By: Sherian Rein 02/24/2011 08:34:40  _____________________________________________________________________  External Attachment:    Type:   Image     Comment:   External Document

## 2011-03-02 NOTE — Consult Note (Signed)
Summary: [facility]   Imported By: Sherian Rein 02/24/2011 08:33:27  _____________________________________________________________________  External Attachment:    Type:   Image     Comment:   External Document

## 2011-03-02 NOTE — Progress Notes (Signed)
Summary: [facility]   Imported By: Sherian Rein 02/24/2011 08:36:58  _____________________________________________________________________  External Attachment:    Type:   Image     Comment:   External Document

## 2011-03-08 LAB — WOUND CULTURE

## 2011-03-17 NOTE — Discharge Summary (Signed)
[name], [name] NO.:  [id number]  MEDICAL RECORD NO.:  [id number]           PATIENT TYPE:  I  LOCATION:  2605                         FACILITY:  [facility]  PHYSICIAN:  Acey Lav, MD  DATE OF BIRTH:  05/14/1951  DATE OF ADMISSION:  02/09/2011 DATE OF DISCHARGE:  02/12/2011                              DISCHARGE SUMMARY   DISCHARGE DIAGNOSES: 1. Recurrent gastrointestinal bleed secondary to gastrointestinal     neurofibromatosis, ulcerations with four submucosal nodules in the     duodenum. 2. Congestive heart failure with nonischemic cardiomyopathy diagnosed in 11/2010 3. Hyperlipidemia.  DISCHARGE MEDICATIONS: 1. Protonix 40 mg p.o. b.i.d. 2. Ferrous gluconate 324 mg p.o. t.i.d. 3. Carvedilol 6.25 mg p.o. b.i.d. 4. Furosemide 20 mg p.o. q.p.m. 5. Lipitor 20 mg p.o. q.p.m. 6. Robitussin DM 10 mL p.o. q.6 h. p.r.n. 7. Diovan 160 mg p.o. q.p.m. 8. Multivitamin one tablet daily.  DISPOSITION AND FOLLOWUP:  Mike [name] was discharged from [facility] on February 12, 2011, in stable and improved condition.  He had no fever, nausea, vomiting, or any black tarry stool or blood in his stool.  If he later develop nausea, vomiting, fever, dizziness or blood in his stool, he needs to make an appointment with his PCP or go to the nearest ED for further evaluation and treatment.  He will have an appointment with his GI doctor, Dr. Yancey Flemings on February 23, 2011 at 2 p.m.Marland Kitchen  At that time, he will need 1. Recheck his CBC to make sure that his hemoglobin is stable. 2. Make sure follow up on his bowel movements for the present of     blood.  CONSULTATION:  GI with Dr. Rachael Fee.  Please see endoscopy procedure report for full consultation note.  PROCEDURE PERFORMED:  Endoscopy on February 09, 2011 shows four submucosal nodules in the duodenum, the largest was 2.5 cm (ulcerated and oozing) and it was treated with injection of diluted epinephrine  and cautery.  Lesion was similar to that noted by Dr. Russella Dar in 2008. Otherwise normal examination.  ADMISSION HISTORY:  Mike [name] is a 60 year old man with past medical history of recurrent GI bleeding from GI neurofibromatosis who came to the ED for GI bleeding.  The patient reports that he had dark stools 1 day prior to admission and then on the morning of admission, he had 4-5 episodes of movement of stool with dark-red blood.  In the ED, FOBT was positive and hemoglobin was 12.2 and his baseline is normally 14 or 15. He has 4-5 previous GI bleeding in the past and most from upper GI neural fibromatosis, treated with cautery with epinephrine and never had surgery, and has been followed by Dr. Marina Goodell.  He has no abdominal pain, dizziness, chest pain or palpitation.  Denies any nausea, vomiting, fever, cough, diarrhea, or dysuria.  Also denies any smoking, ethanol, or drug abuse.  ADMISSION PHYSICAL EXAMINATION:  VITAL SIGNS:  Temperature 97.5, blood pressure 121/61, pulse 107, respiratory rate 18, O2 sat 98% on room air. GENERAL:  No acute distress, normal speech. HEENT:  Normocephalic, atraumatic.  PERLA, EOMI, no icterus or pallor. NECK:  Supple. LUNGS:  Clear to auscultation bilaterally.  No wheezes or crackles. CVS:  Regular rate and rhythm, no murmur, gallops or rub. ABDOMEN:  Soft, nontender, nondistended.  Normal bowel sounds.  No palpable masses. EXTREMITIES:  No edema. NEUROLOGIC:  Alert and oriented x3. MUSCULOSKELETAL:  Motor strength 5/5 in all extremities.  Sensation and deep tendon reflex unremarkable. SKIN:  Diffuse nodule over his body secondary to neurofibromatosis.  ADMISSION LABS:  CBC 14.9, hemoglobin 12.2, hematocrit 35.6, MCV 82.4, platelet 451, neutrophils 69, ANC 10.3.  Sodium 142, potassium 4.3, chloride 111, bicarb 22, glucose 119, BUN 31, creatinine 0.91, T bili 0.7, alk phos 76, AST 29, ALT 58, total protein 6.1, albumin 3.5, calcium 8.6.  PT 12.9,  INR 0.95.  Fecal occult blood positive.  HOSPITAL COURSE: 1. Gastrointestinal bleeding.  The patient has history of recurrent     gastrointestinal bleed secondary to upper gastrointestinal     neurofibromatosis ulceration.  The patient also has darkblack tarry stools with FOBT positive in addition to a hemoglobin dropped     from 14 to 12.2.  This is most likely from upper gastrointestinal     bleed; however, lower gastrointestinal bleed is also possible.  The     patient was admitted to Step-Down Unit and had an endoscopy on     February 09, 2011, which shows four submucosal nodules in the     duodenum with the largest of 2.5 cm, ulcerated and oozing, in which     Dr. Christella Hartigan treated with diluted epinephrine and cautery.  We cycled     his CBC q.4 h on day #1 and his hemoglobin remained stable at 10.7.     On day #2 of admission, the patient continued to have black tarry     stools with no obvious blood and CBC was then cycled at q.8-12 h.     He did not have any further episode of maroon or black tarry stool     on discharge.  The patient was also placed on Protonix     40 mg p.o. b.i.d. as well as ferrous gluconate 324 mg p.o. t.i.d.     given his chronic ongoing blood loss secondary to gastrointestinal     neurofibromatosis.  The patient has a followup appointment with Dr.     Marina Goodell on February 23, 2011 at 2 p.m. to follow up his CBC. 2. Congestive heart failure with nonischemic cardiomyopathy recently     diagnosed with an ejection fraction of 25-30% and diffuse     hypokinesis in December 2011.  BNP level on admission was less than     30, cardiac enzymes were negative. The patient also had a cardiac cath by Dr. Elease Hashimoto on     November 30, 2010, showed minor coronary artery irregularities.     Moderately depressed left ventricular systolic function.  On EKG,     there is some T-wave inversion on lead 4 through lead 6 most likely     secondary to demand ischemia.  There is no ST elevation  or     depression.       We continue his home dose of Coreg and ARB and held his Lasix in     the setting of gastrointestinal bleed.  Patient is to resume his Lasix after discharge. 3. Hyperlipidemia.  We continue his home dose of Lipitor 20 mg p.o.     nightly.  DISCHARGE VITAL SIGNS:  T 98.2, BP 112/65, P75, RR 19. O2 sat 97 RA  DISCHARGE LABORATORY DATA:  WBC 13.0, hemoglobin 10.5, hematocrit 30.7, MCV 81.4, platelet count 491.    ______________________________ Carrolyn Meiers, MD   ______________________________ Acey Lav, MD    MH/MEDQ  D:  02/24/2011  T:  02/25/2011  Job:  [id number]  cc:   [name]. Marina Goodell, MD  Electronically Signed by Carrolyn Meiers MD on 02/25/2011 07:34:33 PM Electronically Signed by Paulette Blanch DAM MD on 03/17/2011 12:27:28 PM

## 2011-05-04 NOTE — H&P (Signed)
NAMEROWAN, BLAKER NO.:  [id number]   MEDICAL RECORD NO.:  [id number]          PATIENT TYPE:   LOCATION:                                 FACILITY:   PHYSICIAN:  Iva Boop, MD,FACG    DATE OF BIRTH:   DATE OF ADMISSION:  08/13/2007  DATE OF DISCHARGE:                              HISTORY & PHYSICAL   PROBLEM:  Black stools.   HISTORY:  Mike [name] is a pleasant 60 year old white male known to [facility] with known history of neurofibromatosis.  He has prior history of GI  bleeding remotely in 1986, and then the last episode was approximately 6  years ago.  At that time he had a bleed identified in the duodenum from  a 2 cm neurofibroma.  His bleeding stopped without any intervention, and  he has not had any bleeding since.  The patient relates that he has had  an endoscopy and colonoscopy with Dr. Marina Goodell as an outpatient since that  time.   At this time, he presents to the emergency room after onset this morning  with black stools x1 at home.  He says he had awakened this morning,  felt fine, and then had a bowel movement which he noticed was very dark  black in color.  He has only had one episode thus far, has had no  associated weakness, dizziness, nausea, vomiting, abdominal discomfort,  etc..  He has not been on any aspirin or NSAIDs or any new medications.  He is hemodynamically stable in the emergency room, had melena on rectal  exam. Hemoglobin is 10.8, and he is admitted at this time for supportive  medical management.   PAST HISTORY:  Pertinent for upper GI bleed in 2002 as outlined above.  Also had remote bleed in 1986.  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   MEDICATIONS:  None on a regular basis.   ALLERGIES:  SULFA which causes a rash.   FAMILY HISTORY:  Mother had neurofibromatosis and is deceased secondary  to cancer, type uncertain.  He has one aunt who is status post partial  colectomy was unclear whether this  was due to colon cancer or other  pathology.   SOCIAL HISTORY:  The patient is married.  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 He is a nonsmoker, nondrinker.   REVIEW OF SYSTEMS:  CARDIOVASCULAR:  Denies any chest pain or anginal  symptoms.  PULMONARY:  Negative for cough, shortness of breath or sputum  production.  GENITOURINARY:  Denies dysuria, hematuria or urgency.  MUSCULOSKELETAL:  Negative.  NEUROLOGIC:  Negative.  PSYCHIATRIC:  Negative.  All other review of systems negative.  GI: As outlined above.   PHYSICAL EXAMINATION:  GENERAL:  Well-developed white male in no acute  distress.  He is alert and oriented x3.  VITAL SIGNS:  Blood pressure 134/82, temperature 98, pulse in the 90s,  respirations 16, O2 saturation 99% on room air.  HEENT:  Nontraumatic, normocephalic.  EOMI.  PERRLA.  Sclerae anicteric.  NECK:  Supple without nodes.  No JVD.  CARDIOVASCULAR:  Regular rate and rhythm with S1-S2, slightly tachy.  Lungs: Clear to auscultation and percussion.  ABDOMEN:  Soft.  Bowel sounds are active.  He is nontender.  There is no  palpable mass or hepatosplenomegaly.  RECTAL:  Exam reveals black stools per ER physician which is heme  positive.  EXTREMITIES:  Without clubbing, cyanosis or edema.  NEUROLOGIC:  Grossly nonfocal.  The patient is alert and oriented x3.  LYMPHATICS:  No palpable nodes.  SKIN: The patient does have multiple neurofibromas of varying sizes  scattered extensively over his entire body.   LABORATORY STUDIES:  WBC of 11.6, hemoglobin 10.8, hematocrit of 32.7,  MCV 86.  Pro time 12.2, INR 0.9. Potassium 3.6, BUN 14, creatinine 0.68.  LFTs within normal limits.   IMPRESSION:  3. A 60 year old white male with acute gastrointestinal bleed,      recurrent, with known neurofibromas of the small bowel. Suspect a      small bowel source.  2. Neurofibromatosis  3. Status post remote splenectomy.   PLAN:  1. The patient is admitted to  the service of Dr. Stan Head to a      regular bed.  2. Serial hemoglobin and hematocrit.  3. Transfuse as indicated.  4. He will be covered with an IV proton pump inhibitor.  5. Schedule for upper endoscopy in the morning.  If this is      unrevealing, will consider capsule endoscopy, and we will review      his previous endoscopic records.      Amy Esterwood, PA-C      Iva Boop, MD,FACG  Electronically Signed    AE/MEDQ  D:  08/13/2007  T:  08/13/2007  Job:  [id number]

## 2011-05-04 NOTE — Discharge Summary (Signed)
NAME:  Mike [name], Mike [name] NO.:  [id number]   MEDICAL RECORD NO.:  [id number]          PATIENT TYPE:  INP   LOCATION:  5004                         FACILITY:  [facility]   PHYSICIAN:  Malcolm T. Russella Dar, MD, FACGDATE OF BIRTH:  13-Jul-1951   DATE OF ADMISSION:  08/13/2007  DATE OF DISCHARGE:  08/17/2007                               DISCHARGE SUMMARY   ADMITTING DIAGNOSES:  85. A 60 year old white male with acute gastrointestinal bleed in the      setting of known neurofibromas of the small bowel, suspect small      bowel source for current bleed.  2. Previous history of gastrointestinal bleeding secondary to duodenal      neurofibroma.  3. Status post remote splenectomy, traumatic.  4. Neurofibromatosis.   DISCHARGE DIAGNOSES:  1. Stable, status post acute upper gastrointestinal bleed secondary to      ulcerated duodenal neurofibroma.  2. Anemia, acute, blood loss secondary to above.  3. Other diagnoses as listed above.   CONSULTATIONS:  None.   PROCEDURES:  Upper endoscopy per Dr. Russella Dar on August 14, 2007 with  epinephrine and bipolar electrocoagulation therapy.   BRIEF HISTORY:  Mr. Squillace is a very nice 60 year old white male known  to [facility], with history of neurofibromatosis.  He has a prior  history of GI bleeding remotely in 1986 and then had an episode for  which he was hospitalized approximately 6 years ago.  At that time, he  had source identified in the duodenum felt secondary to a 2-cm  neurofibroma.  His bleeding stopped without any intervention and he has  not had any bleeding in interval years.  At this time, he presents to  the emergency room after onset the morning of admission with black  stools at home x1.  He has no associated abdominal pain, no weakness,  dizziness, nausea, vomiting, etc.  He denies any regular aspirin or  NSAID use, no new medications.  He was hemodynamically stable in the  emergency room, but did have melena on rectal exam  and a hemoglobin of  10.8.  He was admitted for supportive medical management.   LABORATORY STUDIES:  Again on admission, WBC of 11.6, hemoglobin 10.8,  hematocrit of 32.7, MCV of 86.  Serial values were obtained.  On the  24th, hemoglobin dropped to 9.6, hematocrit of 28; on the 26th, stable  at 9.7 and 28.6 and on the 28th, hemoglobin 9.7 and hematocrit of 28.6.  Pro time 12.2, INR of 0.9.  Electrolytes within normal limits.  BUN was  14, creatinine 0.6 on admission.  Albumin 3.4.  Liver function studies  normal.  H. pylori antibody was done and is still pending.   HOSPITAL COURSE:  The patient was admitted to the service of Dr. Stan Head, who was covering on-call.  He was placed on IV fluids and  b.i.d. IV Protonix.  Serial H&H were obtained.  Fortunately, he had a  very stable course, did not require any blood transfusions.  He  underwent upper endoscopy with Dr. Russella Dar on the morning of 25th and was  found to have  an ulceration on the surface of a neurofibroma in the  second portion of duodenum; this was oozing.  It was injected with  epinephrine and then cauterized.  He had 2 other neurofibromas noted in  that same region of the duodenum.  The ulceration on was located on the  distal aspect of the neurofibroma and was visualized and treated with  the duodenoscope as it was not visualized with the standard forward  viewing endoscope. He was placed on an IV infusion of Protonix.  His  diet was gradually advanced.  He did not have any further evidence of  active bleeding.  His hemoglobin remained stable in the 9.7 range and on  the 28th, he was allowed discharge to home with instructions to follow  up with Dr. Marina Goodell in the office on September 4 and at that time, will  need followup H&H.  He also will be discharged on b.i.d. Protonix for 2  weeks and then daily thereafter, was advised again no aspirin or NSAIDs.  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      Amy Esterwood, PA-C      Malcolm T. Russella Dar, MD, [facility]  Electronically Signed    AE/MEDQ  D:  08/17/2007  T:  08/18/2007  Job:  [id number]

## 2011-05-04 NOTE — Assessment & Plan Note (Signed)
[facility]                         GASTROENTEROLOGY OFFICE NOTE   NAME:Mike [name], Mike [name]                      MRN:          [id number]  DATE:08/24/2007                            DOB:          May 01, 1951    HISTORY:  Orvilla Fus presents today to the office for post hospitalization  followup appointment.  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  He was hospitalized in October of  2002 with acute bleeding due to ulceration of a proximal neurofibroma.  He was evaluated by Dr. Johna Sheriff for possible surgical excision of the  lesions.  However, the patient stabilized and it was elected to proceed  with strict avoidance of non-steroidal anti-inflammatory drugs and close  observation.  He has done extraordinarily well since that time until  August 13, 2007 when he awoke with melena.  He was admitted to the  hospital.  His hemoglobin dropped as low as 9.7 down from his baseline  of 13 or 14.  He underwent upper endoscopy with Dr. Russella Dar who noticed  several neurofibromas, one of which was ulcerated with acute bleeding  and was treated with endoscopic hemostatic therapy.  The bleeding  subsided.  It should be noted that the major ampulla was normal and  distinct from the lesions.  On August 28, he was discharged to home.  No  problems since that time, only with minor fatigue.  He was set up for  this office evaluation.  He was discharged on Protonix.  Prior to going  into the hospital, he was on no medications.  He denies non-steroidal  anti-inflammatory drug use or aspirin use.   ALLERGIES:  SULFA.   CURRENT MEDICATIONS:  Protonix 40 mg b.i.d.   FAMILY HISTORY:  Noncontributory.   SOCIAL HISTORY:  Married, accompanied by his wife.  Works for [employer] as a Engineer, materials.   Does not smoke or use  alcohol.   REVIEW OF SYSTEMS:  Per diagnostic evaluation form.   PHYSICAL EXAMINATION:  A well-appearing male in no acute distress.  Obvious cutaneous neurofibromas.  Blood pressure is 118/64, heart rate is 88, weight is 194.6 pounds.  He  is 6 feet in height.  HEENT:  Sclerae anicteric, conjunctivae are pink.  Oral mucosa is  intact, no adenopathy.  LUNGS:  Clear.  HEART:  Regular.  ABDOMEN:  Soft, nontender, nondistended with good bowel sounds.  No  organomegaly, masses or hernia.  The extremities are without edema.   IMPRESSION:  Neurofibromatosis with proximal intestinal neurofibromas,  complicated by recurrent acute bleeding due to surface ulceration.  The  patient is currently clinically stable.  At this point, I think the  patient is at high risk for recurrent problems without surgical therapy.  Though he did well 6 years previous from his last bleed, I am less  optimistic currently. As he had been off of non-steroidal anti-  inflammatory drugs,this can be eliminated as a contributor currently.   RECOMMENDATIONS:  1.  Continue proton pump inhibitor therapy empirically daily.  Not      clear what degree benefit this will provide, but it seems      reasonable.  2. Daily iron replacement b.i.d.  3. Followup CBC today.  4. Refer back to Dr. Johna Sheriff for consideration of surgical excision      of the culprit and larger intestinal neurofibromas.     [name]. Marina Goodell, MD  Electronically Signed    JNP/MedQ  DD: 08/24/2007  DT: 08/24/2007  Job #: [id number]   cc:   Lorne Skeens. Hoxworth, M.D.

## 2011-05-07 NOTE — Discharge Summary (Signed)
[place]. [facility]  Patient:    Mike [name], HOOGENDOORN Visit Number: [id number] MRN: [id number]          Service Type: MED Location: 5000 [id number] Attending Physician:  Mervin Hack Dictated by:   Dianah Field, P.A. Admit Date:  08/26/2001 Disc. Date: 08/30/01   CC:         [name]. [name]., M.D. [facility] T. Hoxworth, M.D.   Discharge Summary  ADMISSION DIAGNOSES: 1. Upper gastrointestinal bleed secondary to small bowel neurofibroma.    Possibly precipitated by aspirin use. 2. Anemia, acute.  Possibly chronic as well. 3. History of iron-deficiency anemia, on chronic supplementation. 4. Neurofibromatosis with upper intestinal bleeds in the 1980s, 1995, and    heme-positive anemia in February 2001. 5. 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 6. SULFA allergy.  DISCHARGE DIAGNOSES: 1. 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 2. Status post EGD with injection of epinephrine per Dr. Lina Sar on    August 26, 2001. 3. Acute on chronic anemia.  Chronic anemia despite iron supplementation.    Acute anemia secondary to the acute gastrointestinal blood losses.  The    patient was transfused with 2 units of packed red blood cells during this    admission. 4. Neurofibromatosis. 5. Mild cardiomegaly by chest x-ray, no evidence for hypertension during this    admission but needs outpatient follow-up to rule this out.  CONSULTATIONS:  Dr. Glenna Fellows, general surgery.  PROCEDURES:  Upper endoscopy by Dr. Juanda Chance with findings of duodenal mass felt to be neurofibroma containing an ulcerated bleeding area.  BRIEF HISTORY:  Mike [name] is a pleasant 60 year old white male.  Generally, he has no health problems other than a history of neurofibromatosis and GI bleeds associated with duodenal neurofibromas.  He had had a GI bleed in the 1980s when he was in the service in [place].  In 1995, he was  hospitalized by Dr. Marina Goodell for upper GI bleed and at this time he had an upper endoscopy, colonoscopy, and a small bowel enteroclysis.  What Dr. Marina Goodell found on the endoscopy was multiple neurofibromas within the duodenum.  Although these were not bleeding at the time, they were felt to be the source for the GI bleed. Again, in February 2001, he was seen at Dr. Broadus John office because of anemia and Hemoccult positive stool but no overt bleeding.  Endoscopy and colonoscopy were repeated at that time, and again the colonoscopy to the terminal ileum was normal and upper endoscopy including push enteroscopy showed a large submucosal mass in the third portion of the duodenum, which measured at least 4 cm, and a small, approximately 8 mm, submucosal mass in the second duodenum. There was superficial ulceration of this larger mass.  He did reach the proximal jejunum without seeing any further neurofibromas.  Incidentally, he found esophagitis and a large caliber benign-appearing stricture and a sliding hiatal hernia.  Since February 2001, the patient has done pretty well and his hemoglobin in March 2001 was 15.7.  He had been started on iron supplementation.  The patient presented to the emergency room at [facility] on September 7 with a history of one melanic black stool on the morning of admission and a darker but nonmelanic stool on the day prior.  He had some associated weakness and sweating with a bowel movement but no abdominal pain, nausea, vomiting, or hematemesis.  He did say that, because of some achy soreness, he  had been using Aleve over the past several days but had not been using any aspirin.  He was not aware that Aleve could exacerbate or trigger gastrointestinal bleeding.  In the emergency room, his hemoglobin was 10.2. Blood pressure and pulse were stable and he was in no acute distress.  He was admitted by Dr. Lina Sar and underwent upper endoscopy on the day of admission.   Dr. Juanda Chance did not have access to the patients records and she thought that what she saw was a neurofibroma, within which was a bleeding ulcerated area, but she was not certain whether this might not be an enlarged biliary papula; however, on review of the patients office records, it was fairly clear that this was again a neurofibroma that was bleeding.  LABORATORY DATA:  Hemoglobin on admission was 10.2.  Later, on hospital day #1, it was 8.2.  At discharge, hemoglobin was 8.5.  Hematocrit initially was 30, down to 24.1, and final assay was 25.  White blood cell count up to a maximum of 17.7.  It was 13.9 near discharge.  MCV was 82.8.  Platelets ranging from 272 up to 449.  PT was 13.2, INR 1.0, PTT 26.  IV bleeding time was elevated at 13.5 minutes, the reference range is 2-8.5 minutes.  Sodium 136, potassium 4.3, chloride 110.  Glucose was anywhere from 113 up to 121. BUN was initially elevated at 35 but normalized to 13.  Creatinine was 0.8. Calcium slightly low at 7.8.  Albumin 3.3.  Total bilirubin 0.4, alkaline phosphatase 53, AST 17, ALT 18.  Iron was 36, total iron binding capacity 312, iron saturation was 12, and ferritin was 11.  Urinalysis was negative.  September 7 acute abdominal series with PA chest views showed some mild cardiomegaly.  There were multiple minimally prominent loops of small bowel representing a nonspecific bowel gas pattern.  HOSPITAL COURSE:  The patient underwent upper endoscopy by Dr. Juanda Chance.  He was admitted initially to an ICU bed.  By September 8, hospital day #2, he had received 2 units of packed red blood cells.  Hemodynamically, he had been stable overnight.  The elevated BUN was normalizing.  He had had no further stools.  He was transferred out of ICU to a nonmonitored bed on unit 5000.  Over the next few days of his hospitalization, the patients blood counts were tracked and these dropped further down to a low of hemoglobin of 8  but  decision was made not to transfuse him any further, as he was not having any symptomatic repercussions from the anemia, such as dizziness.  He was a bit tired but was not profoundly weak.  His blood pressure was well controlled. At times, he got mildly tachycardic into the low 100s.  Because of the recurrent nature of the patients GI bleeds, Dr. Johna Sheriff was called in to see the patient.  Dr. Johna Sheriff has seen this patient a few years back.  His opinion was to watch the patient off the nonsteroidals.  He did feel that, if the patient had recurrent bleeding, that surgery would probably be the patients best option but for this hospitalization, if he did not have any further bleeding, that surgery was unnecessary.  It turns out that the patient did not have any further acute bleeding.  He passed some small, dark bowel movements but had no large-volume melena. Hemoglobin stabilized and the patient clinically was able to walk down to the cafeteria without difficulty.  He was felt stable  and improved enough for discharge on September 11.  FOLLOW-UP:  He is to see Dr. Marina Goodell in the office within the next 2-4 weeks. At that time, his blood counts can be rechecked.  In the meantime, the patient is able to get the emergency room staff to check his blood counts if he wishes or feels necessary.  He is to call Dr. Broadus John office if he has any further bleeding or symptomatic anemia such as tachycardia, weakness, nausea, or recurrent melena.  DISCHARGE MEDICATIONS:  Iron sulfate 325 mg 1 p.o. t.i.d.  He was again advised not to use any aspirin, Aleve, ibuprofen, etc.  If he had any questions about whether or not a medication could cause GI bleeding, he was to consult a pharmacist or physician.  He was also advised to use stool softener or Milk of Magnesia if needed for iron-related constipation.  RETURN TO WORK:  The patient was okay to return to work on September 23.  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  DIET AT DISCHARGE:  Unrestricted.  The patient does not have a primary care physician and he was advised to establish care with a primary care physician of his choice.  He needs regular medical management.  The chest x-ray had shown some cardiomegaly, which raises question of whether or not the patient has some underlying hypertension and he should have regular general medical care. Dictated by:   Dianah Field, P.A. Attending Physician:  Mervin Hack DD:  08/30/01 TD:  08/30/01 Job: [id number]

## 2011-05-07 NOTE — Procedures (Signed)
[place]. [facility]  Patient:    Mike [name], Mike [name] Visit Number: [id number] MRN: [id number]          Service Type: MED Location: MICU 2115 01 Attending Physician:  Mervin Hack Dictated by:   Hedwig Morton. Juanda Chance, M.D. [facility] Proc. Date: 08/26/01 Admit Date:  08/26/2001   CC:         [name]. [name]., M.D. [facility]   Procedure Report  PROCEDURE: Upper endoscopy.  ENDOSCOPIST: Hedwig Morton. Juanda Chance, M.D.  INDICATIONS FOR PROCEDURE: This is a 60 year old gentleman, with neurofibromatosis and chronic intermittent GI bleed, who presented with melenic stools of 24 hours duration.  Hemoglobin 10.2 g while taking iron. His hemoglobin was 13.5 within the last four weeks.  He denies any abdominal pain.  He has been on Aleve two tablets q.d.  He had previous endoscopy and colonoscopy on three previous occasions, the last one approximately two years ago.  His BUN was elevated at 35.  His stool was melenic and Hemoccult positive.  He is undergoing upper endoscopy to assess him for upper GI bleed. Actually the pediatric colonoscopy was used for enteroscopy in expectation of looking for AV malformations.  ENDOSCOPE: Olympus single-channel pediatric colonoscope.  SEDATION: Versed 10 mg IV, Demerol 100 mg IV.  FINDINGS: The Fujinon single-channel video endoscope was passed under direct vision through the posterior pharynx and esophagus.  The patient was monitored by pulse oximetry and oxygen saturations were normal.  Proximal and distal esophageal mucosa was unremarkable.  There was a small hiatal hernia which was easily reducible, and normal Z-line.  There was no blood in the stomach or esophagus.  Stomach - The stomach was insufflated with air and showed normal pyloric outlet, gastric folds, and lesser curvature of the stomach.  A few coffee-ground specks were refluxing from the duodenum.  Duodenum - The duodenal bulb was normal.  Papilla was visualized in the  second portion of the duodenum.  Distal to the papilla about 3 cm distally was a large polypoid mass which was smooth with large ulceration measuring at least 1 cm in diameter, with an ulcerative center which was oozing low-grade bleed. No arterial spurt.  It was not clear whether this represented neurofibroma or whether this was actually a major papilla which was enlarged.  The descending duodenum and through the ligament of Treitz and distally was entirely normal. The endoscope was then retracted and the bleeding lesion injected with 1 cc of 1:200,000 epinephrine, which seemed to increase the bleeding for about a minute and the bleeding then stopped again.  Because of the uncertainty as to whether this represented major papilla or a neurofibroma no biopsies or further injections were carried out.  The patient tolerated the procedure well.  IMPRESSION: Duodenal mass with bleeding ulcer, ulcerated area; rule out neurofibroma versus enlarged biliary papilla.  PLAN: The patient will be observed in the intensive care unit and transfused as needed.  We will review his old records as to the results of previous endoscopy and findings of the mass, which will have to be further evaluated when the patient stops bleeding.  We will likely proceed with CT scan of the abdomen, liver function tests, and possible biopsy of the lesion. Dictated by:   Hedwig Morton. Juanda Chance, M.D. [facility] Attending Physician:  Mervin Hack DD:  08/26/01 TD:  08/27/01 Job: [id number]

## 2011-05-07 NOTE — H&P (Signed)
[place]. [facility]  Patient:    Mike [name], Mike [name] Visit Number: [id number] MRN: [id number]          Service Type: MED Location: MICU 2115 01 Attending Physician:  Mervin Hack Dictated by:   Sammuel Cooper, P.A. Admit Date:  08/26/2001                           History and Physical  CHIEF COMPLAINT:  Bleeding.  HISTORY OF PRESENT ILLNESS:  The patient is a pleasant 60 year old white male generally in good health. 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 The patient, at this time, presents to the emergency room with complaints of melena with one black stool on the morning of admission. He has had a dark, but no frankly melanic stool the evening previous. He had worked on Friday, September 6th. Says that he felt weak and had one episode of sweating, but no associated abdominal pain, cramping, nausea, vomiting, hematemesis, or melena. He says he has been taking a couple of Aleve per day over the past several days as he was feeling achy and sore, and has been very busy at work. He says he is not regularly taking any aspirin or NSAIDs. The patient was seen and evaluated in the emergency room per Dr. Juanda Chance and felt to be actively bleeding with maroon stool on rectal exam, hemoglobin 10.2 and was admitted to the hospital for stabilization and further diagnostic evaluation.  The patient first had a GI bleed in 1985 while he was in the service in [place]. Apparently he did undergo endoscopy and colonoscopy, at that time, but as far as he was aware there was no specific source found. 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 We do not have those records available today for review. He was last evaluated by Dr. Marina Goodell approximately two years ago for iron deficiency  anemia and again he was told that he had a "lesion" in his colon. He has been on chronic iron supplementation since that time and had a hemoglobin of 13.5 one month ago. Again, he does not regularly use aspirin or NSAIDs, but had been taking some Aleve over the past week.  CURRENT MEDICATIONS: 1. Iron one p.o. b.i.d. He thinks he is on a generic form of ferrous sulfate    325 mg.  ALLERGIES:  SULFA WHICH CAUSES HIVES.  PAST MEDICAL HISTORY: 1. As outlined about, the patient is with neurofibromatosis and two prior GI    bleeds. 2. Status post remote splenectomy at age 80 secondary to an accident.  SOCIAL HISTORY:  The patient is married and has one child. 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 No tobacco, no ETOH.  FAMILY HISTORY:  Pertinent for neurofibromatosis in the patients mother who subsequently has been deceased secondary to cancer, he is uncertain of the type. He has one daughter who has cafe au lait spots, but does not have any evidence of neurofibromas. Family history is negative for colon cancer or polyps as far as he is aware.  REVIEW OF SYSTEMS:  CARDIOVASCULAR:  Denies any chest pain or anginal symptoms. PULMONARY:  Negative for cough, shortness of breath or sputum production. GENITOURINARY:  Negative for dysuria, urgency or frequency. GASTROINTESTINAL:  As  above. MUSCULOSKELETAL:  The patient had been feeling achy nonspecifically in his muscles over the past few days.  PHYSICAL EXAMINATION:  GENERAL:  A well-developed white male in no acute distress. Alert and oriented x 3.  VITAL SIGNS:  Blood pressure 140/87, pulse 95, respiratory rate 16. O2 saturation 100%.  HEENT:  Nontraumatic, normocephalic, EOMI, PERRLA. Sclerae anicteric. Conjunctivae pink. He does have multiple neurofibromas diffusely over his face, neck and some telangiectasias on his face as well.  NECK:  Supple without nodes. Euthyroid.  CARDIOVASCULAR:  Regular rate and rhythm  with S1 and S2. Tachy.  PULMONARY:  Clear to A&P.  ABDOMEN:  Soft. Minimally tender in the epigastrium. No palpable mass or hepatosplenomegaly.  RECTAL:  Maroon stool, grossly positive.  EXTREMITIES:  Without clubbing, cyanosis or edema. Again, he does have multiple scattered neurofibromas all over his body. Scattered cafe au lait spots, as well.  LABORATORY DATA:  On admission; WBC 14.4, hemoglobin 10.2, hematocrit 30, MCV 82, platelets 449. Electrolytes within normal limits. BUN 35, creatinine 0.8, albumin 3.3. Liver function studies are normal. Pro time 13.2, INR 1, PTT 26. Iron studies were ordered and are pending. Ferritin low at 11.  EKG on admission showed sinus tachycardia.  IMPRESSION: 35. A 60 year old white male with neurofibromatosis who presents with an acute    upper gastrointestinal bleed felt likely related to underlying    arteriovenous malformation, possibly precipitated by NSAID use. 2. Anemia, acute and possibly chronic. 3. History of iron deficiency anemia. 4. Neurofibromatosis with two prior gastrointestinal bleeds, probably    secondary to arteriovenous malformation. 5. Status post splenectomy.  PLAN:  The patient is admitted to the service of Dr. Lina Sar for IV fluid resuscitation, transfusions as indicated. He will be kept NPO. Will plan emergent upper endoscopy and enteroscopy, then further diagnostic workup as indicated. Will also need to review his prior endoscopy and colonoscopy, and barium studies.Dictated by:   Sammuel Cooper, P.A. Attending Physician:  Mervin Hack DD:  08/27/01 TD:  08/27/01 Job: [id number]

## 2011-05-07 NOTE — Consult Note (Signed)
[place]  Patient:    Mike [name], Mike [name] Visit Number: [id number] MRN: [id number]          Service Type: MED Location: 5000 [id number] Attending Physician:  Mervin Hack Dictated by:   Lorne Skeens. Hoxworth, M.D. Proc. Date: 08/28/01 Admit Date:  08/26/2001                            Consultation Report  HISTORY OF PRESENT ILLNESS:  I was asked to evaluate Mr. Dues by Dr. Juanda Chance. This patient is a 60 year old, white male known to me from a hospitalization approximately eight years ago for GI bleeding.  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  His first episode was in [place] in [place] and apparently no site was found on endoscopy.  I first met him eight years ago when he was hospitalized for apparent upper GI bleed and EGD and colonoscopy at that point did not reveal an obvious source of bleeding, although bleeding from a neurofibroma was expected.  He quickly stopped and was discharged.  Approximately two years ago, he was found to have some iron deficiency anemia and again underwent endoscopy without obvious source found. He had been doing well until approximately three days ago when he developed weakness and diaphoresis with dark stools.  He was found to have melanotic stool on admission and was admitted.  At this point, hemoglobin was 10.2. This decreased to 8.2 within 24 hours and he received two units of packed cells.  Hemoglobin then increased to 9.8 and over the past 24 hours has drifted down to 8.8 and 8.4 this morning.  At no time, has he been hemodynamically stable.  He has had some mild epigastric soreness, but no significant pain.  He has not had any stools of any kind in the past 24 hours. The patient had been taking Aleve for some body aches for about one week and he was not aware of the GI side effects of this medication.  The patient has undergone upper endoscopy by Dr. 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 There was slow oozing at the time which stopped with epinephrine injection.  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  MEDICATIONS:  Iron.  ALLERGY:  SULFA.  SOCIAL HISTORY:  He is married and accompanied by his wife at the time of evaluation.  He works as a Electrical engineer at [employer].  He does not smoke cigarettes or drink alcohol.  PHYSICAL EXAMINATION:  VITAL SIGNS:  Currently, temperature 99.9, pulse 100, respiratory rate 20, blood pressure 129/88.  GENERAL:  He is alert in no distress.  SKIN:  Innumerable neurofibromas.  HEENT:  No icterus and no adenopathy.  LUNGS:  Clear.  CARDIAC:  Slight tachycardia without murmur.  ABDOMEN:  There is well-healed left paramedian incision.  No distention.  Soft without significant tenderness.  LABORATORY DATA AND X-RAY FINDINGS:  Hemoglobin as above, white count elevated at 16,000.  LFTs were normal.  ASSESSMENT/PLAN:  Upper GI bleed with documented oozing from an apparent 2 cm neurofibroma in the second portion of the duodenum.  RECOMMENDATIONS:  The patient has done relatively well over the past eight years without recurrent bleeding, except for one episode of anemia  that was treated as an outpatient.  He currently appears stable, although his hemoglobin has drifted down slightly.  Certainly, if he continues to have evidence of slow bleeding this hospitalization, I would recommend surgery and excision of the documented area in the duodenum.  If this episode stops, I would tend to favor expectant management.  He has had a long period of time without bleeding and has been on NSAIDs recently which could have contributed to this episode.  Also, his bleed was not massive or life threatening.  I am going to evaluate the  literature for any information on chance of rebleeding from this condition.  This was discussed with the patient and his wife and I will follow closely with you. Dictated by:   Lorne Skeens. Hoxworth, M.D. Attending Physician:  Mervin Hack DD:  08/28/01 TD:  08/28/01 Job: [id number]

## 2011-05-07 NOTE — Discharge Summary (Signed)
[place]  Patient:    Mike [name], Mike [name] Visit Number: [id number] MRN: [id number]          Service Type: MED Location: 5000 [id number] 01 Attending Physician:  Mervin Hack Dictated by:   Dianah Field, P.A. Admit Date:  08/26/2001                             Discharge Summary  NO DICTATION Dictated by:   Dianah Field, P.A. Attending Physician:  Mervin Hack DD:  08/30/01 TD:  08/30/01 Job: [id number]

## 2011-08-20 ENCOUNTER — Other Ambulatory Visit: Payer: Self-pay | Admitting: Internal Medicine

## 2011-08-24 ENCOUNTER — Other Ambulatory Visit: Payer: Self-pay | Admitting: *Deleted

## 2011-08-24 MED ORDER — FERROUS GLUCONATE 324 (38 FE) MG PO TABS: 324.0000 mg | ORAL_TABLET | Freq: Three times a day (TID) | ORAL | Status: DC

## 2011-08-24 NOTE — Telephone Encounter (Signed)
Refill sent.

## 2011-09-11 ENCOUNTER — Encounter: Payer: Self-pay | Admitting: Cardiovascular Disease

## 2011-09-13 ENCOUNTER — Encounter: Payer: Self-pay | Admitting: Cardiovascular Disease

## 2011-09-28 ENCOUNTER — Ambulatory Visit (INDEPENDENT_AMBULATORY_CARE_PROVIDER_SITE_OTHER): Payer: Commercial Managed Care - PPO | Admitting: Cardiovascular Disease

## 2011-09-28 ENCOUNTER — Encounter: Payer: Self-pay | Admitting: Cardiovascular Disease

## 2011-09-28 VITALS — BP 148/91 | HR 77 | Ht 72.0 in | Wt 211.8 lb

## 2011-09-28 DIAGNOSIS — I509 Heart failure, unspecified: Secondary | ICD-10-CM

## 2011-09-28 DIAGNOSIS — I5022 Chronic systolic (congestive) heart failure: Secondary | ICD-10-CM | POA: Insufficient documentation

## 2011-09-28 MED ORDER — CARVEDILOL 6.25 MG PO TABS: 12.5000 mg | ORAL_TABLET | Freq: Two times a day (BID) | ORAL | Status: DC

## 2011-09-28 NOTE — Assessment & Plan Note (Signed)
His congestive heart failure seems to be under good control. We'll increase his carvedilol 12.5 mg twice a day. We'll work harder to avoid salt. I've asked him to exercise on regular basis.  I see him again in 6 months.

## 2011-09-28 NOTE — Patient Instructions (Signed)
Your physician wants you to follow-up in: 6 months You will receive a reminder letter in the mail two months in advance. If you don't receive a letter, please call our office to schedule the follow-up appointment.  Your physician recommends that you return for a FASTING lipid profile: 6 months  Your physician has recommended you make the following change in your medication:   1) increase coreg to 12.5 mg one tablet twice a day.

## 2011-09-28 NOTE — Progress Notes (Signed)
Mike [name] Date of Birth  Feb 09, 1951 [facility] [address]    [address] [place], [place]  [address] [phone]  Fax  [phone]  History of Present Illness:  Mike [name] is a 60 year old gentleman with a history of congestive heart failure.  Cardiac catheterization performed 12/01/2010 reveals mild coronary artery irregularities. His ejection fraction is around 40%.. He's not had any episodes of chest pain or shortness of breath.  He admits to not following a very strict diet recently. His blood pressure has been a little elevated. 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  His readings have been a little bit elevated recently.  Current Outpatient Prescriptions on File Prior to Visit  Medication Sig Dispense Refill  . atorvastatin (LIPITOR) 20 MG tablet Take 20 mg by mouth daily.        . carvedilol (COREG) 6.25 MG tablet Take 6.25 mg by mouth 2 (two) times daily with a meal.        . ferrous gluconate (FERGON) 324 MG tablet Take 1 tablet (324 mg total) by mouth 3 (three) times daily with meals.  90 tablet  3  . furosemide (LASIX) 20 MG tablet Take 20 mg by mouth daily.          Allergies  Allergen Reactions  . Sulfonamide Derivatives     REACTION: Hives    Past Medical History  Diagnosis Date  . CHF (congestive heart failure)     Ejection Fraction 25-30%  . Neurofibromatosis   . History of GI bleed   . SOB (shortness of breath)     Past Surgical History  Procedure Date  . Other surgical history     Splenectomy  . Esophagogastroduodenoscopy 08/14/2007    With epinepherine and bipolar electrocoagulation therapy    History  Smoking status  . Never Smoker   Smokeless tobacco  . Not on file    History  Alcohol Use No    Family History  Problem Relation Age of Onset  . Cancer Mother   . Stroke Father     Reviw of Systems:  Reviewed in the HPI.  All other systems are  negative.  Physical Exam: BP 148/91  Pulse 77  Ht 6' (1.829 m)  Wt 211 lb 12.8 oz (96.072 kg)  BMI 28.73 kg/m2 The patient is alert and oriented x 3.  The mood and affect are normal.   Skin: warm and dry.  Color is normal.    HEENT:   the sclera are nonicteric.  The mucous membranes are moist.  The carotids are 2+ without bruits.  There is no thyromegaly.  There is no JVD.    Lungs: clear.  The chest wall is non tender.    Heart: regular rate with a normal S1 and S2.  There are no murmurs, gallops, or rubs. The PMI is not displaced.     Abdomen: good bowel sounds.  There is no guarding or rebound.  There is no hepatosplenomegaly or tenderness.  There are no masses.   Extremities:  no clubbing, cyanosis, or edema.  The legs are without rashes.  The distal pulses are intact.   Neuro:  Cranial nerves II - XII are intact.  Motor and sensory functions are intact.    The gait is normal.  ECG:  Assessment / Plan:

## 2011-10-01 LAB — DIFFERENTIAL
Basophils Absolute: 0
Basophils Relative: 0
Eosinophils Relative: 2
Lymphocytes Relative: 42
Monocytes Absolute: 1.3 — ABNORMAL HIGH

## 2011-10-01 LAB — CBC
Hemoglobin: 10.8 — ABNORMAL LOW
MCHC: 33
RBC: 3.78 — ABNORMAL LOW
WBC: 11.6 — ABNORMAL HIGH

## 2011-10-01 LAB — CROSSMATCH
ABO/RH(D): A POS
Antibody Screen: NEGATIVE

## 2011-10-01 LAB — COMPREHENSIVE METABOLIC PANEL
AST: 28
Albumin: 3.4 — ABNORMAL LOW
Alkaline Phosphatase: 74
Chloride: 110
GFR calc Af Amer: >60
Potassium: 3.6
Total Bilirubin: 0.4

## 2011-10-01 LAB — HEMOGLOBIN AND HEMATOCRIT, BLOOD
HCT: 28.3 — ABNORMAL LOW
HCT: 28.6 — ABNORMAL LOW
HCT: 29.2 — ABNORMAL LOW
HCT: 30 — ABNORMAL LOW
HCT: 30.2 — ABNORMAL LOW
HCT: 31.4 — ABNORMAL LOW
Hemoglobin: 10.2 — ABNORMAL LOW
Hemoglobin: 10.4 — ABNORMAL LOW
Hemoglobin: 9.6 — ABNORMAL LOW
Hemoglobin: 9.7 — ABNORMAL LOW
Hemoglobin: 9.9 — ABNORMAL LOW

## 2011-10-01 LAB — H. PYLORI ANTIBODY, IGG: H Pylori IgG: <0.4

## 2011-10-01 LAB — OCCULT BLOOD X 1 CARD TO LAB, STOOL: Fecal Occult Bld: POSITIVE

## 2011-10-08 ENCOUNTER — Ambulatory Visit (INDEPENDENT_AMBULATORY_CARE_PROVIDER_SITE_OTHER): Payer: 59

## 2011-10-08 ENCOUNTER — Inpatient Hospital Stay (INDEPENDENT_AMBULATORY_CARE_PROVIDER_SITE_OTHER)
Admission: RE | Admit: 2011-10-08 | Discharge: 2011-10-08 | Disposition: A | Discharge status: Home / Self Care | Payer: 59 | Source: Ambulatory Visit | Attending: Emergency Medicine | Admitting: Emergency Medicine

## 2011-10-08 DIAGNOSIS — J019 Acute sinusitis, unspecified: Secondary | ICD-10-CM

## 2012-01-06 ENCOUNTER — Other Ambulatory Visit: Payer: Self-pay | Admitting: *Deleted

## 2012-01-06 MED ORDER — FUROSEMIDE 20 MG PO TABS: 20.0000 mg | ORAL_TABLET | Freq: Every day | ORAL | Status: DC

## 2012-01-06 NOTE — Telephone Encounter (Signed)
Fax Received. Refill Completed. Mike [name] (R.M.A)   

## 2012-01-26 ENCOUNTER — Other Ambulatory Visit: Payer: Self-pay | Admitting: Cardiovascular Disease

## 2012-02-07 ENCOUNTER — Other Ambulatory Visit: Payer: Self-pay | Admitting: Cardiovascular Disease

## 2012-02-07 NOTE — Telephone Encounter (Signed)
Fax Received. Refill Completed. Day Greb Chowoe (R.M.A)   

## 2012-04-20 ENCOUNTER — Ambulatory Visit (INDEPENDENT_AMBULATORY_CARE_PROVIDER_SITE_OTHER): Payer: 59 | Admitting: Cardiovascular Disease

## 2012-04-20 ENCOUNTER — Encounter: Payer: Self-pay | Admitting: Cardiovascular Disease

## 2012-04-20 VITALS — BP 148/81 | HR 77 | Ht 72.0 in | Wt 210.0 lb

## 2012-04-20 DIAGNOSIS — E785 Hyperlipidemia, unspecified: Secondary | ICD-10-CM

## 2012-04-20 DIAGNOSIS — I509 Heart failure, unspecified: Secondary | ICD-10-CM

## 2012-04-20 MED ORDER — CARVEDILOL 12.5 MG PO TABS: 12.5000 mg | ORAL_TABLET | Freq: Two times a day (BID) | ORAL | Status: DC

## 2012-04-20 MED ORDER — CARVEDILOL 25 MG PO TABS: 25.0000 mg | ORAL_TABLET | Freq: Two times a day (BID) | ORAL | Status: DC

## 2012-04-20 NOTE — Assessment & Plan Note (Addendum)
Mike [name] doing very well. His blood pressure is a little bit elevated. We will increase his carvedilol to 25 mg twice a day. He'll continue with his current dose of Diovan. I've asked him to cut back on his salt intake.

## 2012-04-20 NOTE — Patient Instructions (Addendum)
Your physician recommends that you return for a FASTING lipid profile: NEXT WEEK  Your physician recommends that you schedule a follow-up appointment in: 3 MONTHS   Your physician has recommended you make the following change in your medication:   INCREASE COREG TO 25 MG ONE TABLET TWICE DAILY 12 HRS APART   DASH Diet The DASH diet stands for "Dietary Approaches to Stop Hypertension." It is a healthy eating plan that has been shown to reduce high blood pressure (hypertension) in as little as 14 days, while also possibly providing other significant health benefits. These other health benefits include reducing the risk of breast cancer after menopause and reducing the risk of type 2 diabetes, heart disease, colon cancer, and stroke. Health benefits also include weight loss and slowing kidney failure in patients with chronic kidney disease.  DIET GUIDELINES  Limit salt (sodium). Your diet should contain less than 1500 mg of sodium daily.   Limit refined or processed carbohydrates. Your diet should include mostly whole grains. Desserts and added sugars should be used sparingly.   Include small amounts of heart-healthy fats. These types of fats include nuts, oils, and tub margarine. Limit saturated and trans fats. These fats have been shown to be harmful in the body.  CHOOSING FOODS  The following food groups are based on a 2000 calorie diet. See your Registered Dietitian for individual calorie needs. Grains and Grain Products (6 to 8 servings daily)  Eat More Often: Whole-wheat bread, brown rice, whole-grain or wheat pasta, quinoa, popcorn without added fat or salt (air popped).   Eat Less Often: White bread, white pasta, white rice, cornbread.  Vegetables (4 to 5 servings daily)  Eat More Often: Fresh, frozen, and canned vegetables. Vegetables may be raw, steamed, roasted, or grilled with a minimal amount of fat.   Eat Less Often/Avoid: Creamed or fried vegetables. Vegetables in a cheese  sauce.  Fruit (4 to 5 servings daily)  Eat More Often: All fresh, canned (in natural juice), or frozen fruits. Dried fruits without added sugar. One hundred percent fruit juice ( cup [237 mL] daily).   Eat Less Often: Dried fruits with added sugar. Canned fruit in light or heavy syrup.  [employer], Fish, and Poultry (2 servings or less daily. One serving is 3 to 4 oz [85-114 g]).  Eat More Often: Ninety percent or leaner ground beef, tenderloin, sirloin. Round cuts of beef, chicken breast, [place] breast. All fish. Grill, bake, or broil your meat. Nothing should be fried.   Eat Less Often/Avoid: Fatty cuts of meat, [place], or chicken leg, thigh, or wing. Fried cuts of meat or fish.  Dairy (2 to 3 servings)  Eat More Often: Low-fat or fat-free milk, low-fat plain or light yogurt, reduced-fat or part-skim cheese.   Eat Less Often/Avoid: Milk (whole, 2%, skim, or chocolate).Whole milk yogurt. Full-fat cheeses.  Nuts, Seeds, and Legumes (4 to 5 servings per week)  Eat More Often: All without added salt.   Eat Less Often/Avoid: Salted nuts and seeds, canned beans with added salt.  Fats and Sweets (limited)  Eat More Often: Vegetable oils, tub margarines without trans fats, sugar-free gelatin. Mayonnaise and salad dressings.   Eat Less Often/Avoid: Coconut oils, palm oils, butter, stick margarine, cream, half and half, cookies, candy, pie.  FOR MORE INFORMATION The Dash Diet Eating Plan: www.dashdiet.org Document Released: 11/25/2011 Document Reviewed: 11/15/2011 [facility] Patient Information 2012 [place], [place].

## 2012-04-20 NOTE — Progress Notes (Signed)
   [place] Office [address] [place], [place]  [address] [phone]   Fax  [phone]   [facility] [address], [address] [place], [place]  [address] [phone]  Fax [phone]  [name] Date of Birth  [birth year]       Problem List: 1. Congestive heart failure-original ejection fraction equals 25-30%. minor coronary artery irregularities by heart catheterization 2. Neurofibromatosis 3. Hx of GI bleed   History of Present Illness: Mike [name] is a 61 year old gentleman with a history of congestive heart failure. Cardiac catheterization performed 12/01/2010 reveals mild coronary artery irregularities. His ejection fraction is around 40%.. He's not had any episodes of chest pain or shortness of breath.    Current Outpatient Prescriptions on File Prior to Visit  Medication Sig Dispense Refill  . atorvastatin (LIPITOR) 20 MG tablet TAKE 1 TABLET BY MOUTH DAILY  90 tablet  3  . carvedilol (COREG) 6.25 MG tablet TAKE 2 TABLETS BY MOUTH TWO TIMES DAILY WITH A MEAL  60 tablet  5  . DIOVAN 160 MG tablet TAKE 1 TABLET BY MOUTH DAILY  90 tablet  3  . ferrous gluconate (FERGON) 324 MG tablet Take 1 tablet (324 mg total) by mouth 3 (three) times daily with meals.  90 tablet  3  . furosemide (LASIX) 20 MG tablet Take 1 tablet (20 mg total) by mouth daily.  90 tablet  3  . LORATADINE PO Take by mouth daily.        . Multiple Vitamins-Minerals (CENTRUM SILVER PO) Take by mouth daily.          Allergies  Allergen Reactions  . Sulfonamide Derivatives     REACTION: Hives    Past Medical History  Diagnosis Date  . CHF (congestive heart failure)     Ejection Fraction 25-30%  . Neurofibromatosis   . History of GI bleed   . SOB (shortness of breath)     Past Surgical History  Procedure Date  . Other surgical history     Splenectomy  . Esophagogastroduodenoscopy 08/14/2007    With epinepherine and bipolar electrocoagulation therapy    History    Smoking status  . Never Smoker   Smokeless tobacco  . Not on file    History  Alcohol Use No    Family History  Problem Relation Age of Onset  . Cancer Mother   . Stroke Father     Reviw of Systems:  Reviewed in the HPI.  All other systems are negative.  Physical Exam: Blood pressure 148/81, pulse 77, height 6' (1.829 m), weight 210 lb (95.255 kg). General: Well developed, well nourished, in no acute distress.  Head: Normocephalic, atraumatic, sclera non-icteric, mucus membranes are moist,   Neck: Supple. Carotids are 2 + without bruits. No JVD  Lungs: Clear bilaterally to auscultation.  Heart: regular rate.  normal  S1 S2. No murmurs, gallops or rubs.  Abdomen: Soft, non-tender, non-distended with normal bowel sounds. No hepatomegaly. No rebound/guarding. No masses.  Msk:  Strength and tone are normal  Extremities: No clubbing or cyanosis. No edema.  Distal pedal pulses are 2+ and equal bilaterally.  Neuro: Alert and oriented X 3. Moves all extremities spontaneously.  Psych:  Responds to questions appropriately with a normal affect.  ECG:  Assessment / Plan:

## 2012-04-21 ENCOUNTER — Other Ambulatory Visit: Payer: Self-pay | Admitting: *Deleted

## 2012-04-24 ENCOUNTER — Other Ambulatory Visit: Payer: 59

## 2012-04-28 ENCOUNTER — Other Ambulatory Visit (INDEPENDENT_AMBULATORY_CARE_PROVIDER_SITE_OTHER): Payer: 59

## 2012-04-28 DIAGNOSIS — I509 Heart failure, unspecified: Secondary | ICD-10-CM

## 2012-04-28 DIAGNOSIS — E785 Hyperlipidemia, unspecified: Secondary | ICD-10-CM

## 2012-04-28 LAB — BASIC METABOLIC PANEL
BUN: 16 mg/dL (ref 6–23)
CO2: 27 mEq/L (ref 19–32)
Calcium: 8.9 mg/dL (ref 8.4–10.5)
Creatinine, Ser: 0.9 mg/dL (ref 0.4–1.5)
GFR: 87.35 mL/min (ref >60.00)
Glucose, Bld: 111 mg/dL — ABNORMAL HIGH (ref 70–99)

## 2012-04-28 LAB — LIPID PANEL
HDL: 59.8 mg/dL (ref >39.00)
Triglycerides: 57 mg/dL (ref 0.0–149.0)
VLDL: 11.4 mg/dL (ref 0.0–40.0)

## 2012-04-28 LAB — HEPATIC FUNCTION PANEL
Albumin: 3.6 g/dL (ref 3.5–5.2)
Total Protein: 6.6 g/dL (ref 6.0–8.3)

## 2012-07-20 ENCOUNTER — Ambulatory Visit (INDEPENDENT_AMBULATORY_CARE_PROVIDER_SITE_OTHER): Payer: 59 | Admitting: Cardiovascular Disease

## 2012-07-20 ENCOUNTER — Encounter: Payer: Self-pay | Admitting: Cardiovascular Disease

## 2012-07-20 VITALS — BP 143/77 | HR 77 | Ht 72.0 in | Wt 215.0 lb

## 2012-07-20 DIAGNOSIS — I509 Heart failure, unspecified: Secondary | ICD-10-CM

## 2012-07-20 MED ORDER — VALSARTAN 320 MG PO TABS: 320.0000 mg | ORAL_TABLET | Freq: Every day | ORAL | Status: DC

## 2012-07-20 NOTE — Progress Notes (Signed)
   [place] [facility] [address] [place], [place]  [address] [phone]   Fax  [phone]   [facility] [address], [address] [place], [place]  [address] [phone]  Fax [phone]  Mike [name] Date of Birth  1951-12-12       Problem List: 1. Congestive heart failure-original ejection fraction equals 25-30%. minor coronary artery irregularities by heart catheterization 2. Neurofibromatosis 3. Hx of GI bleed   History of Present Illness: Mike [name] is a 61 year old gentleman with a history of congestive heart failure. Cardiac catheterization performed 12/01/2010 reveals mild coronary artery irregularities. His ejection fraction is around 40%. He's not had any episodes of chest pain or shortness of breath.   Current Outpatient Prescriptions on File Prior to Visit  Medication Sig Dispense Refill  . atorvastatin (LIPITOR) 20 MG tablet TAKE 1 TABLET BY MOUTH DAILY  90 tablet  3  . carvedilol (COREG) 25 MG tablet Take 1 tablet (25 mg total) by mouth 2 (two) times daily with a meal.  60 tablet  5  . DIOVAN 160 MG tablet TAKE 1 TABLET BY MOUTH DAILY  90 tablet  3  . ferrous gluconate (FERGON) 324 MG tablet Take 1 tablet (324 mg total) by mouth 3 (three) times daily with meals.  90 tablet  3  . furosemide (LASIX) 20 MG tablet Take 1 tablet (20 mg total) by mouth daily.  90 tablet  3  . LORATADINE PO Take by mouth daily.        . Multiple Vitamins-Minerals (CENTRUM SILVER PO) Take by mouth daily.          Allergies  Allergen Reactions  . Sulfonamide Derivatives     REACTION: Hives    Past Medical History  Diagnosis Date  . CHF (congestive heart failure)     Ejection Fraction 25-30%  . Neurofibromatosis   . History of GI bleed   . SOB (shortness of breath)     Past Surgical History  Procedure Date  . Other surgical history     Splenectomy  . Esophagogastroduodenoscopy 08/14/2007    With epinepherine and bipolar electrocoagulation therapy      History  Smoking status  . Never Smoker   Smokeless tobacco  . Not on file    History  Alcohol Use No    Family History  Problem Relation Age of Onset  . Cancer Mother   . Stroke Father     Reviw of Systems:  Reviewed in the HPI.  All other systems are negative.  Physical Exam: Blood pressure 143/77, pulse 77, height 6' (1.829 m), weight 215 lb (97.523 kg). General: Well developed, well nourished, in no acute distress.  Head: Normocephalic, atraumatic, sclera non-icteric, mucus membranes are moist,   Neck: Supple. Carotids are 2 + without bruits. No JVD  Lungs: Clear bilaterally to auscultation.  Heart: regular rate.  normal  S1 S2. No murmurs, gallops or rubs.  Abdomen: Soft, non-tender, non-distended with normal bowel sounds. No hepatomegaly. No rebound/guarding. No masses.  Msk:  Strength and tone are normal  Extremities: No clubbing or cyanosis. No edema.  Distal pedal pulses are 2+ and equal bilaterally.  Neuro: Alert and oriented X 3. Moves all extremities spontaneously.  Psych:  Responds to questions appropriately with a normal affect.  ECG:  Assessment / Plan:

## 2012-07-20 NOTE — Patient Instructions (Addendum)
Your physician has requested that you have an echocardiogram. Echocardiography is a painless test that uses sound waves to create images of your heart. It provides your doctor with information about the size and shape of your heart and how well your heart's chambers and valves are working. This procedure takes approximately one hour. There are no restrictions for this procedure.  Your physician recommends that you schedule a follow-up appointment in: 3 months   Your physician has recommended you make the following change in your medication:   INCREASE DIOVAN TO 320 MG DAILY, NEW SCRIPT SENT FOR NEW DOSE  Your physician recommends that you return for lab work in: 3 MONTHS //BMET

## 2012-07-20 NOTE — Assessment & Plan Note (Signed)
Mike [name] seems  to be doing fairly well. His blood pressure is mildly elevated. We'll increase his Diovan to 320 mg a day. I would like to get another echocardiogram. His last echo was from 2011.

## 2012-07-28 ENCOUNTER — Ambulatory Visit (HOSPITAL_COMMUNITY): Payer: 59 | Attending: Cardiovascular Disease | Admitting: Radiology

## 2012-07-28 DIAGNOSIS — I080 Rheumatic disorders of both mitral and aortic valves: Secondary | ICD-10-CM | POA: Insufficient documentation

## 2012-07-28 DIAGNOSIS — I079 Rheumatic tricuspid valve disease, unspecified: Secondary | ICD-10-CM | POA: Insufficient documentation

## 2012-07-28 DIAGNOSIS — I509 Heart failure, unspecified: Secondary | ICD-10-CM | POA: Insufficient documentation

## 2012-07-28 NOTE — Progress Notes (Signed)
Echocardiogram performed.  

## 2012-08-30 ENCOUNTER — Other Ambulatory Visit: Payer: Self-pay | Admitting: Internal Medicine

## 2012-10-19 ENCOUNTER — Other Ambulatory Visit: Payer: 59

## 2012-10-24 ENCOUNTER — Ambulatory Visit: Payer: 59 | Admitting: Cardiovascular Disease

## 2012-10-27 ENCOUNTER — Other Ambulatory Visit: Payer: 59

## 2012-10-27 ENCOUNTER — Ambulatory Visit: Payer: 59 | Admitting: Cardiovascular Disease

## 2012-11-01 ENCOUNTER — Other Ambulatory Visit: Payer: Self-pay | Admitting: *Deleted

## 2012-11-01 MED ORDER — CARVEDILOL 25 MG PO TABS: 25.0000 mg | ORAL_TABLET | Freq: Two times a day (BID) | ORAL | Status: DC

## 2012-11-01 NOTE — Telephone Encounter (Signed)
Fax Received. Refill Completed. Mike [name] (R.M.A)   

## 2012-11-07 ENCOUNTER — Other Ambulatory Visit (INDEPENDENT_AMBULATORY_CARE_PROVIDER_SITE_OTHER): Payer: 59

## 2012-11-07 ENCOUNTER — Other Ambulatory Visit: Payer: 59

## 2012-11-07 DIAGNOSIS — I509 Heart failure, unspecified: Secondary | ICD-10-CM

## 2012-11-07 LAB — BASIC METABOLIC PANEL
BUN: 21 mg/dL (ref 6–23)
Calcium: 9 mg/dL (ref 8.4–10.5)
Creatinine, Ser: 0.9 mg/dL (ref 0.4–1.5)
GFR: 90.56 mL/min (ref >60.00)

## 2012-11-22 ENCOUNTER — Encounter: Payer: Self-pay | Admitting: Cardiovascular Disease

## 2012-11-22 ENCOUNTER — Ambulatory Visit (INDEPENDENT_AMBULATORY_CARE_PROVIDER_SITE_OTHER): Payer: 59 | Admitting: Cardiovascular Disease

## 2012-11-22 VITALS — BP 116/80 | HR 65 | Ht 72.0 in | Wt 205.8 lb

## 2012-11-22 DIAGNOSIS — I509 Heart failure, unspecified: Secondary | ICD-10-CM

## 2012-11-22 DIAGNOSIS — I10 Essential (primary) hypertension: Secondary | ICD-10-CM

## 2012-11-22 DIAGNOSIS — E785 Hyperlipidemia, unspecified: Secondary | ICD-10-CM

## 2012-11-22 LAB — HEPATIC FUNCTION PANEL
ALT: 28 U/L (ref 0–53)
Albumin: 3.8 g/dL (ref 3.5–5.2)
Alkaline Phosphatase: 85 U/L (ref 39–117)
Bilirubin, Direct: 0 mg/dL (ref 0.0–0.3)
Total Protein: 7.3 g/dL (ref 6.0–8.3)

## 2012-11-22 LAB — LIPID PANEL
Cholesterol: 139 mg/dL (ref 0–200)
LDL Cholesterol: 71 mg/dL (ref 0–99)
Triglycerides: 59 mg/dL (ref 0.0–149.0)

## 2012-11-22 MED ORDER — LOSARTAN POTASSIUM 100 MG PO TABS: 100.0000 mg | ORAL_TABLET | Freq: Every day | ORAL | Status: DC

## 2012-11-22 NOTE — Progress Notes (Signed)
[place] Office [address] [place], [place]  [address] [phone]   Fax  [phone]   [facility] [address] [place], [place]  [address] [phone]  Fax [phone]  [name] Date of Birth  1951-07-27       Problem List: 1. Congestive heart failure-original ejection fraction equals 25-30% - improved to 50%.  . minor coronary artery irregularities by heart catheterization 2. Neurofibromatosis 3. Hx of GI bleed   History of Present Illness: Mike [name] is a 61 year old gentleman with a history of congestive heart failure. Cardiac catheterization performed 12/01/2010 reveals mild coronary artery irregularities. His ejection fraction is around 50%. He's not had any episodes of chest pain or shortness of breath.  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    The cost of his Diovan will be increasing after the first of the year. He would like to change to losartan.  Current Outpatient Prescriptions on File Prior to Visit  Medication Sig Dispense Refill  . atorvastatin (LIPITOR) 20 MG tablet TAKE 1 TABLET BY MOUTH DAILY  90 tablet  3  . carvedilol (COREG) 25 MG tablet Take 1 tablet (25 mg total) by mouth 2 (two) times daily with a meal.  60 tablet  5  . ferrous gluconate (FERGON) 324 MG tablet TAKE 1 TABLET (324 MG TOTAL) BY MOUTH 3 (THREE) TIMES DAILY WITH MEALS.  90 tablet  PRN  . furosemide (LASIX) 20 MG tablet Take 1 tablet (20 mg total) by mouth daily.  90 tablet  3  . Multiple Vitamins-Minerals (CENTRUM SILVER PO) Take by mouth daily.        . valsartan (DIOVAN) 320 MG tablet Take 1 tablet (320 mg total) by mouth daily.  90 tablet  3    Allergies  Allergen Reactions  . Sulfonamide Derivatives     REACTION: Hives    Past Medical History  Diagnosis Date  . CHF (congestive heart failure)     Ejection Fraction 25-30%  . Neurofibromatosis(237.7)   . History of GI bleed   . SOB (shortness of breath)      Past Surgical History  Procedure Date  . Other surgical history     Splenectomy  . Esophagogastroduodenoscopy 08/14/2007    With epinepherine and bipolar electrocoagulation therapy    History  Smoking status  . Never Smoker   Smokeless tobacco  . Not on file    History  Alcohol Use No    Family History  Problem Relation Age of Onset  . Cancer Mother   . Stroke Father     Reviw of Systems:  Reviewed in the HPI.  All other systems are negative.  Physical Exam: Blood pressure 116/80, pulse 65, height 6' (1.829 m), weight 205 lb 12.8 oz (93.35 kg). General: Well developed, well nourished, in no acute distress.  Head: Normocephalic, atraumatic, sclera non-icteric, mucus membranes are moist,   Neck: Supple. Carotids are 2 + without bruits. No JVD  Lungs: Clear bilaterally to auscultation.  Heart: regular rate.  normal  S1 S2. No murmurs, gallops or rubs.  Abdomen: Soft, non-tender, non-distended with normal bowel sounds. No hepatomegaly. No rebound/guarding. No masses.  Msk:  Strength and tone are normal  Extremities: No clubbing or cyanosis. No edema.  Distal pedal pulses are 2+ and equal bilaterally.  Neuro: Alert and oriented X 3. Moves all extremities spontaneously.  Psych:  Responds to questions appropriately with a normal affect.  ECG:  Dec. 4, 2013 :  NSR at 12 with 1st degree AV block. Poor R wave progression.  Assessment / Plan:

## 2012-11-22 NOTE — Assessment & Plan Note (Signed)
He is on Atorvastatin.  Will check fasting lipids, HFP, ( BMP was recently  done)  today and again in 6 months ( along with BMP).

## 2012-11-22 NOTE — Patient Instructions (Addendum)
Your physician wants you to follow-up in: 6 months  You will receive a reminder letter in the mail two months in advance. If you don't receive a letter, please call our office to schedule the follow-up appointment.  Your physician recommends that you return for lab work in: today and in 2 months   Your physician has recommended you make the following change in your medication:   Stop diovan when this bottle runs out Start losartan once diovan done.

## 2012-11-22 NOTE — Assessment & Plan Note (Signed)
His left ventricular systolic function has improved on medical therapy. He's not had any symptoms of congestive heart failure. I have reminded him to eat a low-salt diet. He is to exercise on a regular basis.  We will change his Diovan to Losartan 100 mg a day. We will have him return and to check a basic profile in 2 months. I'll see him in 6 months for followup visit, fasting labs including lipid profile, hepatic profile, and basic metabolic profile.

## 2012-12-08 IMAGING — CT CT ORBITS W/ CM
3 of 4 series · 16 of 47 positions shown, 19 images · IV contrast (agent unspecified)
Comparison: None.

CLINICAL DATA: 59-year-old male with sinus congestion. RIS states
neurofibromatosis.  Query mass.

CT ORBITS WITH CONTRAST
TECHNIQUE: Multidetector CT imaging of the orbits was performed
following the bolus administration of intravenous contrast.
Contrast: 8 ml [id number].

[Series 2: orbit/facial 2.0 h30s · axial · 0.42mm/px · z∈[+245,+321]mm · 11 of 44 slices shown, 14 images]
[im 3/44  brain]
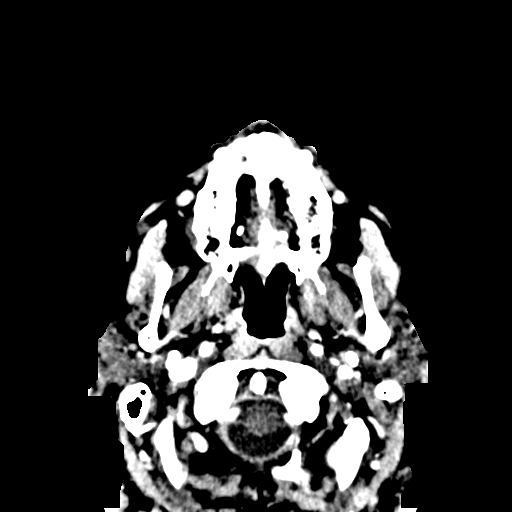
[im 3/44  bone]
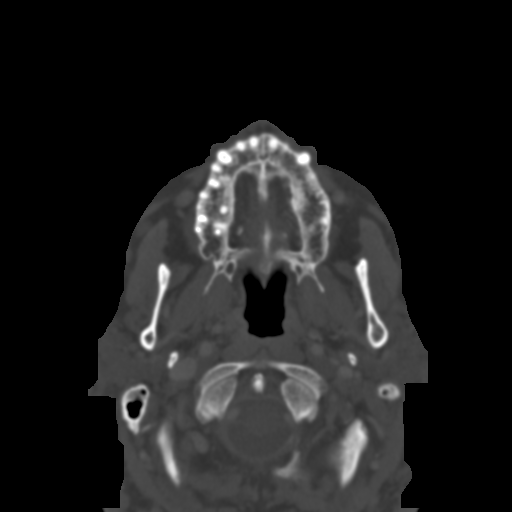
[im 6/44  bone]
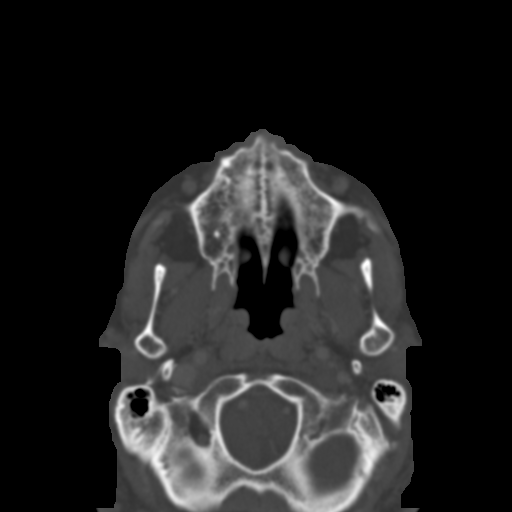
[im 11/44  bone]
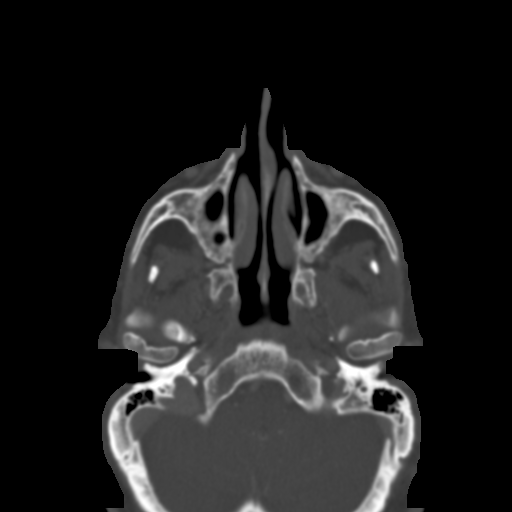
[im 14/44  bone]
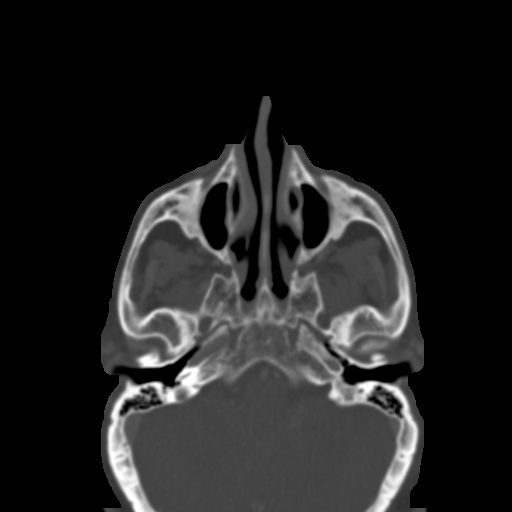
[im 18/44  brain]
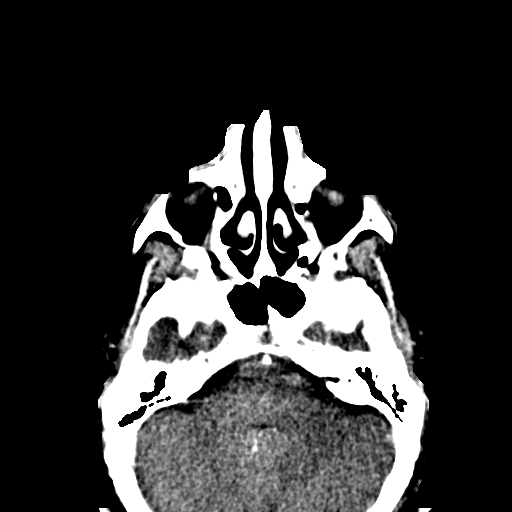
[im 18/44  bone]
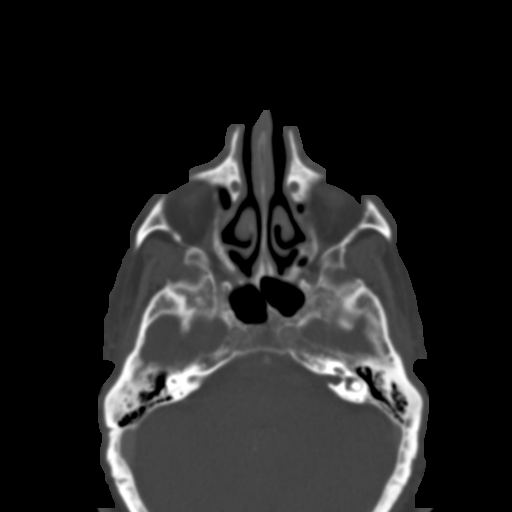
[im 23/44  bone]
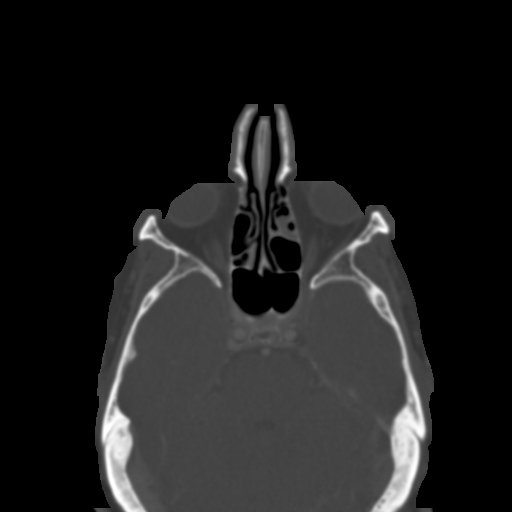
[im 26/44  bone]
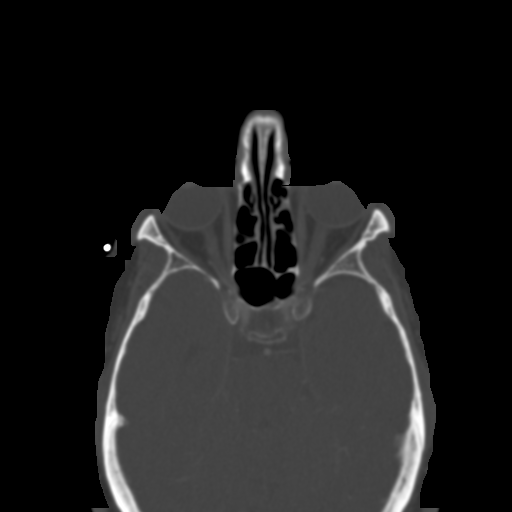
[im 30/44  bone]
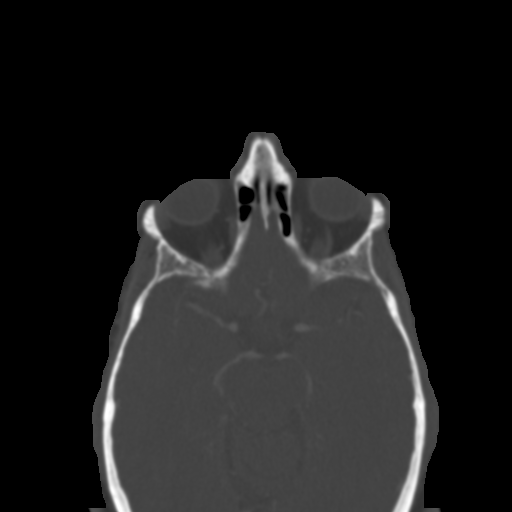
[im 33/44  brain]
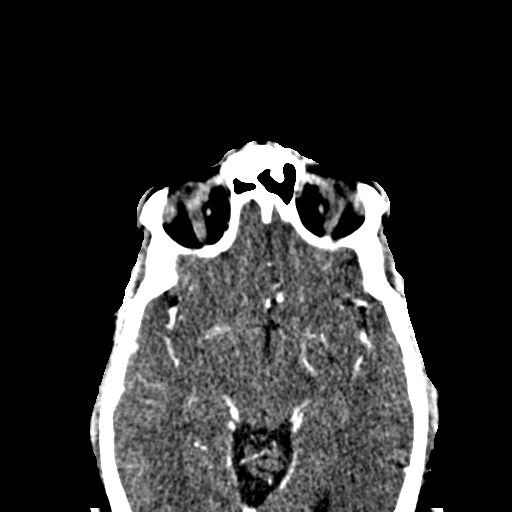
[im 33/44  bone]
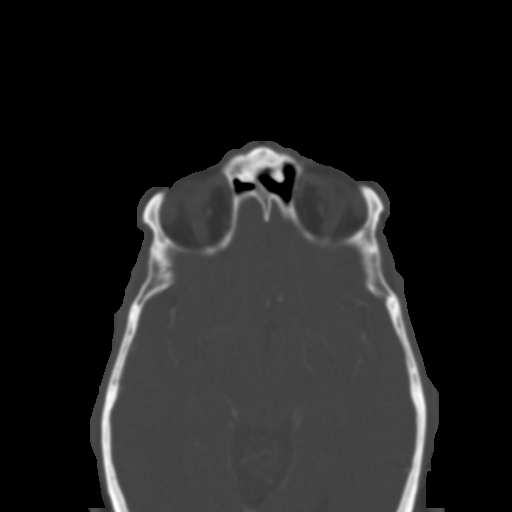
[im 38/44  bone]
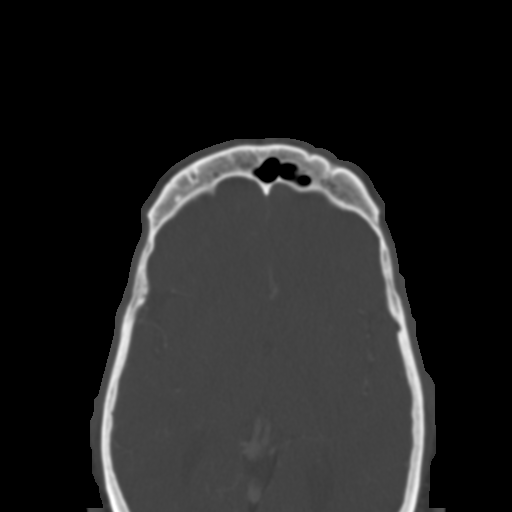
[im 41/44  bone]
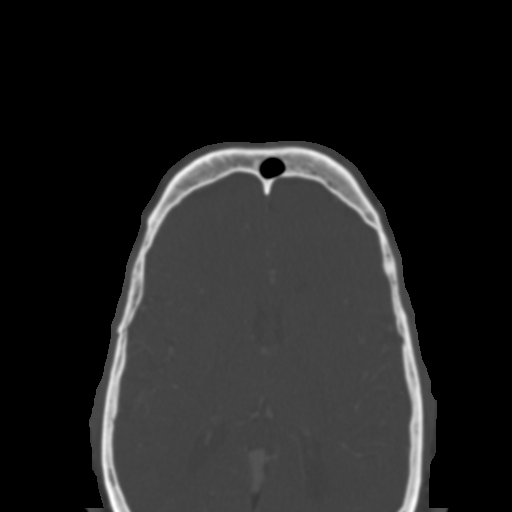

[Series 5: orbit/facial 2.0 spo sag · sagittal · 0.39mm/px · 2 of 70 slices shown]
[im 24/70  bone]
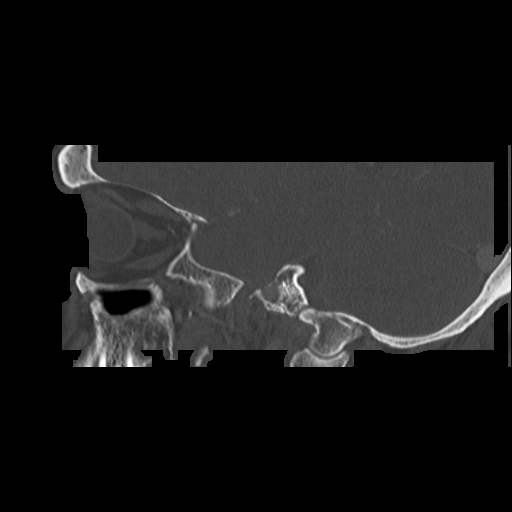
[im 47/70  bone]
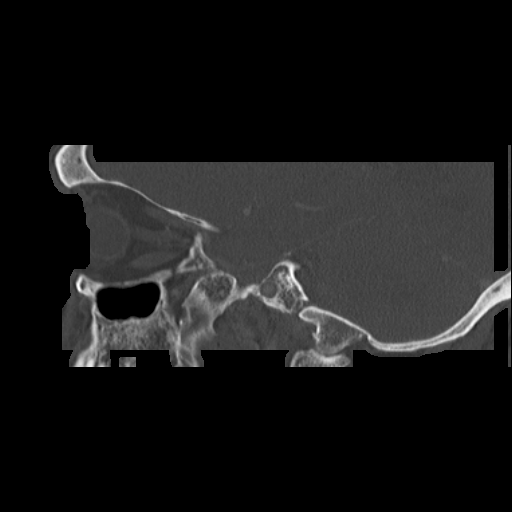

[Series 8: orbit/facial 2.0 spo cor · coronal · 0.36mm/px · 3 of 66 slices shown]
[im 22/66  bone]
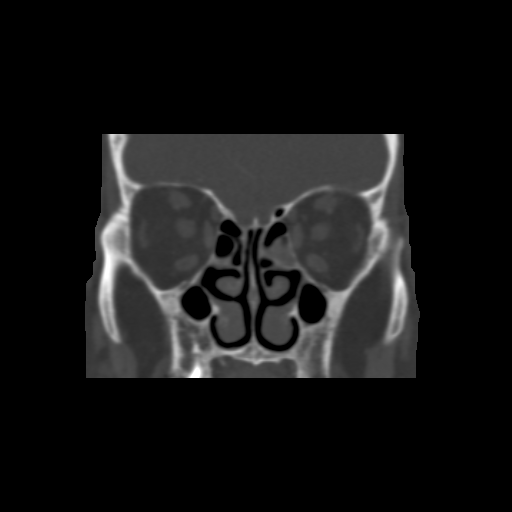
[im 29/66  bone]
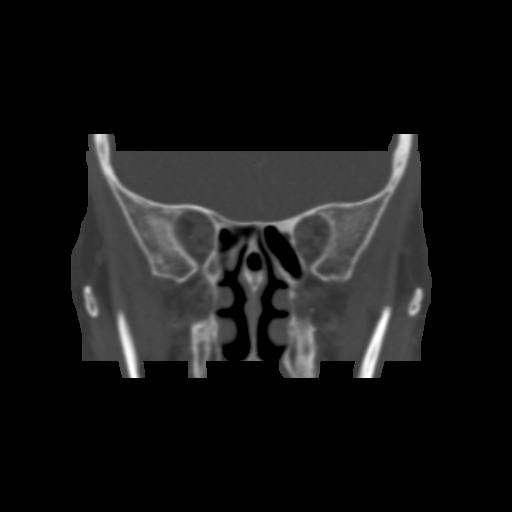
[im 37/66  bone]
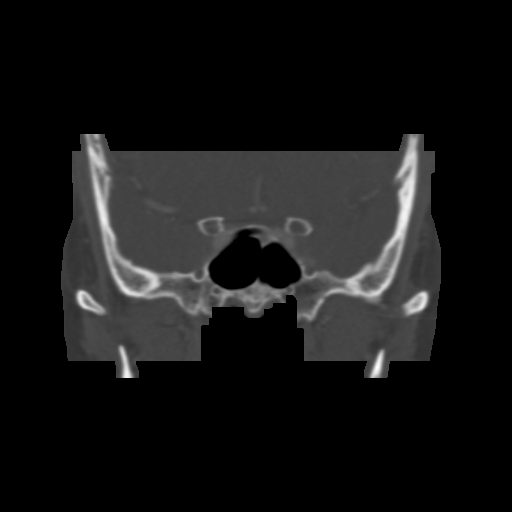

[16 of 47 positions shown; findings below may reference images not displayed]

FINDINGS: Diffuse superficial scalp and scan the nodular masses.
Marked skin thickening in areas. Visualized deep soft tissue spaces
of the face are unaffected and within normal limits.

Visualized noncontrast brain parenchyma including the optic chiasm
and intracranial optic nerves are within normal limits. Visualized
major vascular structures are patent.

Periosteal thickening of the maxillary sinuses. Visualized
paranasal sinuses and mastoids are clear.  No acute osseous
abnormality identified.

Globes are within normal limits.  Intraorbital optic nerves are
within normal limits.  No intraorbital inflammatory changes or
mass.
IMPRESSION: 1.  Neurofibromatosis type 1 with widespread cutaneous
manifestation.
2.  No orbital mass or acute findings.

## 2013-01-02 ENCOUNTER — Other Ambulatory Visit: Payer: Self-pay | Admitting: *Deleted

## 2013-01-02 MED ORDER — FUROSEMIDE 20 MG PO TABS: 20.0000 mg | ORAL_TABLET | Freq: Every day | ORAL | Status: DC

## 2013-01-02 NOTE — Telephone Encounter (Signed)
Fax Received. Refill Completed. Mike [name] (R.M.A)   

## 2013-01-12 ENCOUNTER — Encounter (HOSPITAL_COMMUNITY): Payer: Self-pay | Admitting: Emergency Medicine

## 2013-01-12 ENCOUNTER — Emergency Department (HOSPITAL_COMMUNITY): Payer: 59

## 2013-01-12 ENCOUNTER — Emergency Department (HOSPITAL_COMMUNITY)
Admission: EM | Admit: 2013-01-12 | Discharge: 2013-01-12 | Disposition: A | Discharge status: Home / Self Care | Payer: 59 | Attending: Emergency Medicine | Admitting: Emergency Medicine

## 2013-01-12 DIAGNOSIS — Z8709 Personal history of other diseases of the respiratory system: Secondary | ICD-10-CM | POA: Insufficient documentation

## 2013-01-12 DIAGNOSIS — R059 Cough, unspecified: Secondary | ICD-10-CM | POA: Insufficient documentation

## 2013-01-12 DIAGNOSIS — Z85848 Personal history of malignant neoplasm of other parts of nervous tissue: Secondary | ICD-10-CM | POA: Insufficient documentation

## 2013-01-12 DIAGNOSIS — J40 Bronchitis, not specified as acute or chronic: Secondary | ICD-10-CM | POA: Insufficient documentation

## 2013-01-12 DIAGNOSIS — I509 Heart failure, unspecified: Secondary | ICD-10-CM | POA: Insufficient documentation

## 2013-01-12 DIAGNOSIS — R6883 Chills (without fever): Secondary | ICD-10-CM | POA: Insufficient documentation

## 2013-01-12 DIAGNOSIS — Z79899 Other long term (current) drug therapy: Secondary | ICD-10-CM | POA: Insufficient documentation

## 2013-01-12 DIAGNOSIS — Z8719 Personal history of other diseases of the digestive system: Secondary | ICD-10-CM | POA: Insufficient documentation

## 2013-01-12 DIAGNOSIS — R197 Diarrhea, unspecified: Secondary | ICD-10-CM | POA: Insufficient documentation

## 2013-01-12 DIAGNOSIS — R05 Cough: Secondary | ICD-10-CM | POA: Insufficient documentation

## 2013-01-12 MED ORDER — HYDROCODONE-HOMATROPINE 5-1.5 MG/5ML PO SYRP: 5.0000 mL | ORAL_SOLUTION | Freq: Four times a day (QID) | ORAL | Status: DC | PRN

## 2013-01-12 MED ORDER — AZITHROMYCIN 250 MG PO TABS: ORAL_TABLET | ORAL | Status: DC

## 2013-01-12 NOTE — ED Notes (Signed)
Pt discharged.Vital signs stable and GCS 15 

## 2013-01-12 NOTE — ED Provider Notes (Signed)
History     CSN: [id number]  Arrival date & time 01/12/13  1191   First MD Initiated Contact with Patient 01/12/13 0800      No chief complaint on file.   (Consider location/radiation/quality/duration/timing/severity/associated sxs/prior treatment) HPI Comments: Patient comes to the ER for evaluation of body aches, chills, sore throat, congestion and cough. Symptoms have been present for approximately 10 days. He has been taking Mucinex without improvement. He is not short of breath. He has had no nausea or vomiting, says his stools have been soft last couple of days. There is no abdominal pain. Patient concerned because the last time this happened he ended up having congestive heart failure.   Past Medical History  Diagnosis Date  . CHF (congestive heart failure)     Ejection Fraction 25-30%  . Neurofibromatosis(237.7)   . History of GI bleed   . SOB (shortness of breath)     Past Surgical History  Procedure Date  . Other surgical history     Splenectomy  . Esophagogastroduodenoscopy 08/14/2007    With epinepherine and bipolar electrocoagulation therapy    Family History  Problem Relation Age of Onset  . Cancer Mother   . Stroke Father     History  Substance Use Topics  . Smoking status: Never Smoker   . Smokeless tobacco: Not on file  . Alcohol Use: No      Review of Systems  Constitutional: Positive for chills. Negative for fever.  Respiratory: Positive for cough.   Cardiovascular: Negative for chest pain.  Gastrointestinal: Positive for diarrhea. Negative for vomiting and abdominal pain.  Musculoskeletal: Positive for myalgias.  All other systems reviewed and are negative.    Allergies  Sulfonamide derivatives  Home Medications   Current Outpatient Rx  Name  Route  Sig  Dispense  Refill  . ATORVASTATIN CALCIUM 20 MG PO TABS      TAKE 1 TABLET BY MOUTH DAILY   90 tablet   3   . CARVEDILOL 25 MG PO TABS   Oral   Take 1 tablet (25 mg total) by  mouth 2 (two) times daily with a meal.   60 tablet   5   . FERROUS GLUCONATE 324 (38 FE) MG PO TABS      TAKE 1 TABLET (324 MG TOTAL) BY MOUTH 3 (THREE) TIMES DAILY WITH MEALS.   90 tablet   PRN   . FUROSEMIDE 20 MG PO TABS   Oral   Take 1 tablet (20 mg total) by mouth daily.   90 tablet   3   . LOSARTAN POTASSIUM 100 MG PO TABS   Oral   Take 1 tablet (100 mg total) by mouth daily.   90 tablet   3     Discontinue diovan   . CENTRUM SILVER PO   Oral   Take by mouth daily.             There were no vitals taken for this visit.  Physical Exam  Constitutional: He is oriented to person, place, and time. He appears well-developed and well-nourished. No distress.  HENT:  Head: Normocephalic and atraumatic.  Right Ear: Hearing normal.  Nose: Nose normal.  Mouth/Throat: Oropharynx is clear and moist and mucous membranes are normal.  Eyes: Conjunctivae normal and EOM are normal. Pupils are equal, round, and reactive to light.  Neck: Normal range of motion. Neck supple.  Cardiovascular: Normal rate, regular rhythm, S1 normal and S2 normal.  Exam reveals no gallop  and no friction rub.   No murmur heard. Pulmonary/Chest: Effort normal and breath sounds normal. No respiratory distress. He exhibits no tenderness.  Abdominal: Soft. Normal appearance and bowel sounds are normal. There is no hepatosplenomegaly. There is no tenderness. There is no rebound, no guarding, no tenderness at McBurney's point and negative Murphy's sign. No hernia.  Musculoskeletal: Normal range of motion.  Neurological: He is alert and oriented to person, place, and time. He has normal strength. No cranial nerve deficit or sensory deficit. Coordination normal. GCS eye subscore is 4. GCS verbal subscore is 5. GCS motor subscore is 6.  Skin: Skin is warm, dry and intact. No rash noted. No cyanosis.  Psychiatric: He has a normal mood and affect. His speech is normal and behavior is normal. Thought content  normal.    ED Course  Procedures (including critical care time)  Labs Reviewed - No data to display Dg Chest 2 View  01/12/2013  *RADIOLOGY REPORT*  Clinical Data: Cough.  Congestion.  Chest tightness.  History hypertension and CHF.  CHEST - 2 VIEW  Comparison: 10/08/2011  Findings: Midline trachea.  Mild cardiomegaly.  Mild biapical pleural thickening. No pleural effusion or pneumothorax.  Mild nonspecific lower lobe predominant interstitial thickening is similar. No lobar consolidation.  IMPRESSION: Cardiomegaly without congestive failure.   Original Report Authenticated By: Jeronimo Greaves, M.D.      Diagnosis: Bronchitis    MDM  Patient presents with cough and congestion lasting more than a week. She has been using over-the-counter medications without improvement. Patient is concerned because one time before he was told he was in congestive heart failure we had similar symptoms. Clinically, however, he has bronchitis picture, no sign of heart failure. Chest x-ray did not show any edema and there is no pneumonia. Patient will be treated as an outpatient with antibiotic coverage.       Gilda Crease, MD 01/12/13 [phone]

## 2013-01-12 NOTE — ED Notes (Signed)
Generalized body ache,soar throat and not feeling well.

## 2013-01-12 NOTE — ED Notes (Signed)
Pt presents to ED with generalized body ache and feeling unwell for the last one week.

## 2013-01-15 ENCOUNTER — Telehealth: Payer: Self-pay | Admitting: Cardiovascular Disease

## 2013-01-15 NOTE — Telephone Encounter (Signed)
Spoke to patient he stated he went to ER this past Friday 01/12/13.States he thought he had CHF again.States was told had URI and Bronchitis.States a ZPac was prescribed and just wanted to know if he needs any more medication.Advised after taking ZPac if symptoms don't improve he should call PCP.

## 2013-01-15 NOTE — Telephone Encounter (Signed)
Pt calling re being congested , and wants to know what's ok to take

## 2013-01-16 NOTE — Telephone Encounter (Signed)
msg left to call back with further questions or concerns, advised him to get daily wts to see if chf complications, msg left to call back with questions or concerns.

## 2013-01-23 ENCOUNTER — Other Ambulatory Visit: Payer: 59

## 2013-02-09 ENCOUNTER — Other Ambulatory Visit: Payer: Self-pay | Admitting: *Deleted

## 2013-02-09 MED ORDER — ATORVASTATIN CALCIUM 20 MG PO TABS: 20.0000 mg | ORAL_TABLET | Freq: Every day | ORAL | Status: DC

## 2013-02-09 NOTE — Telephone Encounter (Signed)
Fax Received. Refill Completed. Mike [name] (R.M.A)   

## 2013-02-15 ENCOUNTER — Ambulatory Visit (INDEPENDENT_AMBULATORY_CARE_PROVIDER_SITE_OTHER): Payer: 59 | Admitting: *Deleted

## 2013-02-15 DIAGNOSIS — I509 Heart failure, unspecified: Secondary | ICD-10-CM

## 2013-02-15 DIAGNOSIS — D1330 Benign neoplasm of unspecified part of small intestine: Secondary | ICD-10-CM

## 2013-02-15 DIAGNOSIS — E785 Hyperlipidemia, unspecified: Secondary | ICD-10-CM

## 2013-02-15 LAB — BASIC METABOLIC PANEL
CO2: 28 mEq/L (ref 19–32)
Calcium: 9 mg/dL (ref 8.4–10.5)
Chloride: 105 mEq/L (ref 96–112)
Potassium: 4.3 mEq/L (ref 3.5–5.1)
Sodium: 139 mEq/L (ref 135–145)

## 2013-02-19 ENCOUNTER — Other Ambulatory Visit: Payer: 59

## 2013-05-15 ENCOUNTER — Other Ambulatory Visit: Payer: Self-pay | Admitting: *Deleted

## 2013-05-15 MED ORDER — CARVEDILOL 25 MG PO TABS: 25.0000 mg | ORAL_TABLET | Freq: Two times a day (BID) | ORAL | Status: DC

## 2013-05-15 NOTE — Telephone Encounter (Signed)
Fax Received. Refill Completed. Mike [name] (R.M.A)   

## 2013-07-04 ENCOUNTER — Ambulatory Visit (INDEPENDENT_AMBULATORY_CARE_PROVIDER_SITE_OTHER): Payer: 59 | Admitting: Cardiovascular Disease

## 2013-07-04 ENCOUNTER — Encounter: Payer: Self-pay | Admitting: Cardiovascular Disease

## 2013-07-04 VITALS — BP 120/76 | HR 76 | Ht 72.0 in | Wt 206.0 lb

## 2013-07-04 DIAGNOSIS — I509 Heart failure, unspecified: Secondary | ICD-10-CM

## 2013-07-04 DIAGNOSIS — I5023 Acute on chronic systolic (congestive) heart failure: Secondary | ICD-10-CM

## 2013-07-04 DIAGNOSIS — E785 Hyperlipidemia, unspecified: Secondary | ICD-10-CM

## 2013-07-04 NOTE — Progress Notes (Signed)
[facility] [address] [place], [place]  [address] [phone]   Fax  [phone]   [facility] [address] [place], [place]  [address] [phone]  Fax [phone]  [name] Date of Birth  1951-03-25       Problem List: 1. Congestive heart failure-original ejection fraction equals 25-30% - improved to 50%.  . minor coronary artery irregularities by heart catheterization 2. Neurofibromatosis 3. Hx of GI bleed   History of Present Illness: Mike [name] is a 62 year old gentleman with a history of congestive heart failure. Cardiac catheterization performed 12/01/2010 reveals mild coronary artery irregularities. His ejection fraction is around 50%. He's not had any episodes of chest pain or shortness of breath.  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    The cost of his Diovan will be increasing after the first of the year. He would like to change to losartan.  July 04, 2013:  Mike [name] is doing well.  He has   Current Outpatient Prescriptions on File Prior to Visit  Medication Sig Dispense Refill  . atorvastatin (LIPITOR) 20 MG tablet Take 1 tablet (20 mg total) by mouth daily.  90 tablet  3  . carvedilol (COREG) 25 MG tablet Take 1 tablet (25 mg total) by mouth 2 (two) times daily with a meal.  60 tablet  5  . ferrous gluconate (FERGON) 324 MG tablet Take 324 mg by mouth 3 (three) times daily with meals.      . furosemide (LASIX) 20 MG tablet Take 20 mg by mouth daily.      Marland Kitchen loratadine (CLARITIN) 10 MG tablet Take 10 mg by mouth daily.      Marland Kitchen losartan (COZAAR) 100 MG tablet Take 100 mg by mouth daily.      . Multiple Vitamins-Minerals (CENTRUM SILVER PO) Take 1 tablet by mouth daily.        No current facility-administered medications on file prior to visit.    Allergies  Allergen Reactions  . Sulfonamide Derivatives Hives    Past Medical History  Diagnosis Date  . CHF (congestive heart failure)    Ejection Fraction 25-30%  . Neurofibromatosis   . History of GI bleed   . SOB (shortness of breath)     Past Surgical History  Procedure Laterality Date  . Other surgical history      Splenectomy  . Esophagogastroduodenoscopy  08/14/2007    With epinepherine and bipolar electrocoagulation therapy    History  Smoking status  . Never Smoker   Smokeless tobacco  . Not on file    History  Alcohol Use No    Family History  Problem Relation Age of Onset  . Cancer Mother   . Stroke Father     Reviw of Systems:  Reviewed in the HPI.  All other systems are negative.  Physical Exam: Blood pressure 120/76, pulse 76, height 6' (1.829 m), weight 206 lb (93.441 kg), SpO2 97.00%. General: Well developed, well nourished, in no acute distress.  Head: Normocephalic, atraumatic, sclera non-icteric, mucus membranes are moist,   Neck: Supple. Carotids are 2 + without bruits. No JVD  Lungs: Clear bilaterally to auscultation.  Heart: regular rate.  normal  S1 S2. No murmurs, gallops or rubs.  Abdomen: Soft, non-tender, non-distended with normal bowel sounds. No hepatomegaly. No rebound/guarding. No masses.  Msk:  Strength and tone are normal  Extremities: No clubbing or cyanosis. No edema.  Distal pedal pulses  are 2+ and equal bilaterally.  Neuro: Alert and oriented X 3. Moves all extremities spontaneously.  Psych:  Responds to questions appropriately with a normal affect.  ECG: Dec. 4, 2013 :  NSR at 55 with 1st degree AV block. Poor R wave progression.  Assessment / Plan:

## 2013-07-04 NOTE — Assessment & Plan Note (Signed)
Mike [name] is doing well.  Continue his current meds.  He needs to watch his salt.  i'll see him in 6 months.  Continue same medications.

## 2013-07-04 NOTE — Patient Instructions (Addendum)
Your physician recommends that you return for a FASTING lipid profile: NEXT WEEK  Your physician wants you to follow-up in: 6 MONTHS  You will receive a reminder letter in the mail two months in advance. If you don't receive a letter, please call our office to schedule the follow-up appointment.

## 2013-07-10 ENCOUNTER — Other Ambulatory Visit (INDEPENDENT_AMBULATORY_CARE_PROVIDER_SITE_OTHER): Payer: 59

## 2013-07-10 DIAGNOSIS — I509 Heart failure, unspecified: Secondary | ICD-10-CM

## 2013-07-10 DIAGNOSIS — E785 Hyperlipidemia, unspecified: Secondary | ICD-10-CM

## 2013-07-10 DIAGNOSIS — I5023 Acute on chronic systolic (congestive) heart failure: Secondary | ICD-10-CM

## 2013-07-10 LAB — LIPID PANEL
Cholesterol: 149 mg/dL (ref 0–200)
LDL Cholesterol: 78 mg/dL (ref 0–99)
Total CHOL/HDL Ratio: 3
Triglycerides: 63 mg/dL (ref 0.0–149.0)
VLDL: 12.6 mg/dL (ref 0.0–40.0)

## 2013-07-10 LAB — HEPATIC FUNCTION PANEL
Albumin: 3.8 g/dL (ref 3.5–5.2)
Bilirubin, Direct: 0 mg/dL (ref 0.0–0.3)
Total Protein: 6.8 g/dL (ref 6.0–8.3)

## 2013-07-10 LAB — BASIC METABOLIC PANEL WITH GFR
BUN: 18 mg/dL (ref 6–23)
CO2: 26 meq/L (ref 19–32)
Calcium: 9.1 mg/dL (ref 8.4–10.5)
Chloride: 107 meq/L (ref 96–112)
Creatinine, Ser: 0.8 mg/dL (ref 0.4–1.5)
GFR: 100.61 mL/min
Glucose, Bld: 104 mg/dL — ABNORMAL HIGH (ref 70–99)
Potassium: 4 meq/L (ref 3.5–5.1)
Sodium: 140 meq/L (ref 135–145)

## 2013-10-01 ENCOUNTER — Other Ambulatory Visit: Payer: Self-pay | Admitting: Internal Medicine

## 2013-11-26 ENCOUNTER — Other Ambulatory Visit: Payer: Self-pay | Admitting: Cardiovascular Disease

## 2013-11-29 ENCOUNTER — Other Ambulatory Visit: Payer: Self-pay | Admitting: *Deleted

## 2013-11-29 DIAGNOSIS — E785 Hyperlipidemia, unspecified: Secondary | ICD-10-CM

## 2013-11-29 NOTE — Progress Notes (Signed)
Lab orders placed for future visit.

## 2013-12-27 ENCOUNTER — Other Ambulatory Visit: Payer: Self-pay | Admitting: Cardiovascular Disease

## 2013-12-28 ENCOUNTER — Other Ambulatory Visit (INDEPENDENT_AMBULATORY_CARE_PROVIDER_SITE_OTHER): Payer: 59

## 2013-12-28 DIAGNOSIS — E785 Hyperlipidemia, unspecified: Secondary | ICD-10-CM

## 2013-12-28 LAB — LIPID PANEL
CHOL/HDL RATIO: 3
Cholesterol: 155 mg/dL (ref 0–200)
HDL: 61.1 mg/dL (ref >39.00)
LDL Cholesterol: 82 mg/dL (ref 0–99)
Triglycerides: 61 mg/dL (ref 0.0–149.0)
VLDL: 12.2 mg/dL (ref 0.0–40.0)

## 2013-12-28 LAB — HEPATIC FUNCTION PANEL
ALK PHOS: 67 U/L (ref 39–117)
ALT: 27 U/L (ref 0–53)
AST: 24 U/L (ref 0–37)
Albumin: 3.9 g/dL (ref 3.5–5.2)
BILIRUBIN DIRECT: 0 mg/dL (ref 0.0–0.3)
BILIRUBIN TOTAL: 0.7 mg/dL (ref 0.3–1.2)
Total Protein: 7 g/dL (ref 6.0–8.3)

## 2013-12-28 LAB — BASIC METABOLIC PANEL
BUN: 19 mg/dL (ref 6–23)
CALCIUM: 9 mg/dL (ref 8.4–10.5)
CO2: 24 meq/L (ref 19–32)
CREATININE: 1 mg/dL (ref 0.4–1.5)
Chloride: 110 mEq/L (ref 96–112)
GFR: 83.75 mL/min (ref >60.00)
GLUCOSE: 104 mg/dL — AB (ref 70–99)
Potassium: 3.8 mEq/L (ref 3.5–5.1)
SODIUM: 141 meq/L (ref 135–145)

## 2013-12-31 ENCOUNTER — Other Ambulatory Visit: Payer: Self-pay | Admitting: Cardiovascular Disease

## 2014-01-01 ENCOUNTER — Telehealth: Payer: Self-pay | Admitting: Cardiovascular Disease

## 2014-01-01 NOTE — Telephone Encounter (Signed)
Patient notified of recent lab results being "within normal limits". Patient verbalized understanding appreciation for phone call.

## 2014-01-01 NOTE — Progress Notes (Signed)
Quick Note:  Patient notified of recent lab results being "within normal limits". Patient verbalized understanding appreciation for phone call. ______

## 2014-01-01 NOTE — Telephone Encounter (Signed)
New message  ° ° °Test results  °

## 2014-01-01 NOTE — Telephone Encounter (Signed)
Lm on machine to call back.

## 2014-01-11 ENCOUNTER — Other Ambulatory Visit: Payer: 59

## 2014-01-14 ENCOUNTER — Other Ambulatory Visit: Payer: Self-pay | Admitting: Cardiovascular Disease

## 2014-01-16 ENCOUNTER — Encounter (INDEPENDENT_AMBULATORY_CARE_PROVIDER_SITE_OTHER): Payer: Self-pay

## 2014-01-16 ENCOUNTER — Ambulatory Visit (INDEPENDENT_AMBULATORY_CARE_PROVIDER_SITE_OTHER): Payer: 59 | Admitting: Cardiovascular Disease

## 2014-01-16 ENCOUNTER — Encounter: Payer: Self-pay | Admitting: Cardiovascular Disease

## 2014-01-16 VITALS — BP 114/72 | HR 67 | Resp 14 | Ht 72.0 in | Wt 211.8 lb

## 2014-01-16 DIAGNOSIS — I5022 Chronic systolic (congestive) heart failure: Secondary | ICD-10-CM

## 2014-01-16 DIAGNOSIS — I509 Heart failure, unspecified: Secondary | ICD-10-CM

## 2014-01-16 DIAGNOSIS — E785 Hyperlipidemia, unspecified: Secondary | ICD-10-CM

## 2014-01-16 NOTE — Patient Instructions (Signed)
Your physician recommends that you return for a FASTING lipid profile: 6 months   Your physician wants you to follow-up in:6 months  You will receive a reminder letter in the mail two months in advance. If you don't receive a letter, please call our office to schedule the follow-up appointment.  Your physician recommends that you continue on your current medications as directed. Please refer to the Current Medication list given to you today.  

## 2014-01-16 NOTE — Assessment & Plan Note (Signed)
Mike [name] is doing well.  He's not having any symptoms related to congestive heart failure. We will continue with his same medications.

## 2014-01-16 NOTE — Progress Notes (Signed)
[facility] [address] [place], [place]  [address] [phone]   Fax  [facility] [address] [place], [place]  [address] [phone]  Fax [facility] Date of Birth  November 20, 1951       Problem List: 1. Congestive heart failure-original ejection fraction equals 25-30% - improved to 50%.  . minor coronary artery irregularities by heart catheterization 2. Neurofibromatosis 3. Hx of GI bleed   History of Present Illness: Mike [name] is a 63 year old gentleman with a history of congestive heart failure. Cardiac catheterization performed 12/01/2010 reveals mild coronary artery irregularities. His ejection fraction is around 50%. He's not had any episodes of chest pain or shortness of breath.  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    The cost of his Diovan will be increasing after the first of the year. He would like to change to losartan.  July 04, 2013:  Mike [name] is doing well.  He has not had any problems  01/16/2014:  Mike [name] is doing well.  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    Current Outpatient Prescriptions on File Prior to Visit  Medication Sig Dispense Refill  . atorvastatin (LIPITOR) 20 MG tablet Take 1 tablet (20 mg total) by mouth daily.  90 tablet  3  . carvedilol (COREG) 25 MG tablet TAKE 1 TABLET BY MOUTH TWICE DAILY WITH MEALS  60 tablet  PRN  . ferrous gluconate (FERGON) 324 MG tablet TAKE 1 TABLET BY MOUTH 3 TIMES DAILY WITH MEALS  90 tablet  0  . furosemide (LASIX) 20 MG tablet TAKE 1 TABLET BY MOUTH DAILY.  90 tablet  0  . loratadine (CLARITIN) 10 MG tablet Take 10 mg by mouth daily.      Marland Kitchen losartan (COZAAR) 100 MG tablet TAKE 1 TABLET BY MOUTH DAILY.  90 tablet  PRN  . Multiple Vitamins-Minerals (CENTRUM SILVER PO) Take 1 tablet by mouth daily.        No current facility-administered medications on file  prior to visit.    Allergies  Allergen Reactions  . Sulfonamide Derivatives Hives    Past Medical History  Diagnosis Date  . CHF (congestive heart failure)     Ejection Fraction 25-30%  . Neurofibromatosis   . History of GI bleed   . SOB (shortness of breath)     Past Surgical History  Procedure Laterality Date  . Other surgical history      Splenectomy  . Esophagogastroduodenoscopy  08/14/2007    With epinepherine and bipolar electrocoagulation therapy    History  Smoking status  . Never Smoker   Smokeless tobacco  . Not on file    History  Alcohol Use No    Family History  Problem Relation Age of Onset  . Cancer Mother   . Stroke Father     Reviw of Systems:  Reviewed in the HPI.  All other systems are negative.  Physical Exam: Blood pressure 114/72, pulse 67, resp. rate 14, height 6' (1.829 m), weight 211 lb 12.8 oz (96.072 kg). General: Well developed, well nourished, in no acute distress.  Head: Normocephalic, atraumatic, sclera non-icteric, mucus membranes are moist,   Neck: Supple. Carotids are 2 + without bruits. No JVD  Lungs: Clear bilaterally to auscultation.  Heart: regular rate.  normal  S1 S2. No murmurs, gallops or rubs.  Abdomen:  Soft, non-tender, non-distended with normal bowel sounds. No hepatomegaly. No rebound/guarding. No masses.  Msk:  Strength and tone are normal  Extremities: No clubbing or cyanosis. No edema.  Distal pedal pulses are 2+ and equal bilaterally.  Neuro: Alert and oriented X 3. Moves all extremities spontaneously.  Psych:  Responds to questions appropriately with a normal affect.  ECG: Jan. 28 2015:  NSR at 67,  1st degree AV block,  Poor R wave progression. .  Assessment / Plan:

## 2014-01-21 ENCOUNTER — Emergency Department (INDEPENDENT_AMBULATORY_CARE_PROVIDER_SITE_OTHER)
Admission: EM | Admit: 2014-01-21 | Discharge: 2014-01-21 | Disposition: A | Discharge status: Home / Self Care | Payer: 59 | Source: Home / Self Care | Attending: Family Medicine | Admitting: Family Medicine

## 2014-01-21 ENCOUNTER — Encounter (HOSPITAL_COMMUNITY): Payer: Self-pay | Admitting: Emergency Medicine

## 2014-01-21 DIAGNOSIS — J069 Acute upper respiratory infection, unspecified: Secondary | ICD-10-CM

## 2014-01-21 DIAGNOSIS — B9789 Other viral agents as the cause of diseases classified elsewhere: Principal | ICD-10-CM

## 2014-01-21 MED ORDER — GUAIFENESIN-CODEINE 100-10 MG/5ML PO SOLN: 5.0000 mL | Freq: Every evening | ORAL | Status: DC | PRN

## 2014-01-21 MED ORDER — IPRATROPIUM BROMIDE 0.06 % NA SOLN: 2.0000 | Freq: Four times a day (QID) | NASAL | Status: DC

## 2014-01-21 MED ORDER — PREDNISONE 10 MG PO TABS: 30.0000 mg | ORAL_TABLET | Freq: Every day | ORAL | Status: DC

## 2014-01-21 MED ORDER — AZITHROMYCIN 250 MG PO TABS: 250.0000 mg | ORAL_TABLET | Freq: Every day | ORAL | Status: DC

## 2014-01-21 NOTE — ED Notes (Signed)
C/o  Body aches.  Sinus pressure and pain.  Nonproductive cough.  X 5 days.  No relief with sudafed and mucinex.  Denies fever, n/v/d

## 2014-01-21 NOTE — Discharge Instructions (Signed)
Thank you for coming in today. Take prednisone daily for 5 days. Use Atrovent nasal spray for runny nose as needed. Use codeine containing cough medication at bedtime. Cannot take before driving or working. If not getting better after taking azithromycin antibiotics. Call or go to the emergency room if you get worse, have trouble breathing, have chest pains, or palpitations.   Upper Respiratory Infection, Adult An upper respiratory infection (URI) is also sometimes known as the common cold. The upper respiratory tract includes the nose, sinuses, throat, trachea, and bronchi. Bronchi are the airways leading to the lungs. Most people improve within 1 week, but symptoms can last up to 2 weeks. A residual cough may last even longer.  CAUSES Many different viruses can infect the tissues lining the upper respiratory tract. The tissues become irritated and inflamed and often become very moist. Mucus production is also common. A cold is contagious. You can easily spread the virus to others by oral contact. This includes kissing, sharing a glass, coughing, or sneezing. Touching your mouth or nose and then touching a surface, which is then touched by another person, can also spread the virus. SYMPTOMS  Symptoms typically develop 1 to 3 days after you come in contact with a cold virus. Symptoms vary from person to person. They may include:  Runny nose.  Sneezing.  Nasal congestion.  Sinus irritation.  Sore throat.  Loss of voice (laryngitis).  Cough.  Fatigue.  Muscle aches.  Loss of appetite.  Headache.  Low-grade fever. DIAGNOSIS  You might diagnose your own cold based on familiar symptoms, since most people get a cold 2 to 3 times a year. Your caregiver can confirm this based on your exam. Most importantly, your caregiver can check that your symptoms are not due to another disease such as strep throat, sinusitis, pneumonia, asthma, or epiglottitis. Blood tests, throat tests, and X-rays  are not necessary to diagnose a common cold, but they may sometimes be helpful in excluding other more serious diseases. Your caregiver will decide if any further tests are required. RISKS AND COMPLICATIONS  You may be at risk for a more severe case of the common cold if you smoke cigarettes, have chronic heart disease (such as heart failure) or lung disease (such as asthma), or if you have a weakened immune system. The very young and very old are also at risk for more serious infections. Bacterial sinusitis, middle ear infections, and bacterial pneumonia can complicate the common cold. The common cold can worsen asthma and chronic obstructive pulmonary disease (COPD). Sometimes, these complications can require emergency medical care and may be life-threatening. PREVENTION  The best way to protect against getting a cold is to practice good hygiene. Avoid oral or hand contact with people with cold symptoms. Wash your hands often if contact occurs. There is no clear evidence that vitamin C, vitamin E, echinacea, or exercise reduces the chance of developing a cold. However, it is always recommended to get plenty of rest and practice good nutrition. TREATMENT  Treatment is directed at relieving symptoms. There is no cure. Antibiotics are not effective, because the infection is caused by a virus, not by bacteria. Treatment may include:  Increased fluid intake. Sports drinks offer valuable electrolytes, sugars, and fluids.  Breathing heated mist or steam (vaporizer or shower).  Eating chicken soup or other clear broths, and maintaining good nutrition.  Getting plenty of rest.  Using gargles or lozenges for comfort.  Controlling fevers with ibuprofen or acetaminophen as directed by  your caregiver.  Increasing usage of your inhaler if you have asthma. Zinc gel and zinc lozenges, taken in the first 24 hours of the common cold, can shorten the duration and lessen the severity of symptoms. Pain medicines  may help with fever, muscle aches, and throat pain. A variety of non-prescription medicines are available to treat congestion and runny nose. Your caregiver can make recommendations and may suggest nasal or lung inhalers for other symptoms.  HOME CARE INSTRUCTIONS   Only take over-the-counter or prescription medicines for pain, discomfort, or fever as directed by your caregiver.  Use a warm mist humidifier or inhale steam from a shower to increase air moisture. This may keep secretions moist and make it easier to breathe.  Drink enough water and fluids to keep your urine clear or pale yellow.  Rest as needed.  Return to work when your temperature has returned to normal or as your caregiver advises. You may need to stay home longer to avoid infecting others. You can also use a face mask and careful hand washing to prevent spread of the virus. SEEK MEDICAL CARE IF:   After the first few days, you feel you are getting worse rather than better.  You need your caregiver's advice about medicines to control symptoms.  You develop chills, worsening shortness of breath, or brown or red sputum. These may be signs of pneumonia.  You develop yellow or brown nasal discharge or pain in the face, especially when you bend forward. These may be signs of sinusitis.  You develop a fever, swollen neck glands, pain with swallowing, or white areas in the back of your throat. These may be signs of strep throat. SEEK IMMEDIATE MEDICAL CARE IF:   You have a fever.  You develop severe or persistent headache, ear pain, sinus pain, or chest pain.  You develop wheezing, a prolonged cough, cough up blood, or have a change in your usual mucus (if you have chronic lung disease).  You develop sore muscles or a stiff neck. Document Released: 06/01/2001 Document Revised: 02/28/2012 Document Reviewed: 04/09/2011 [facility] Patient Information 2014 [place], [place].

## 2014-01-21 NOTE — ED Provider Notes (Signed)
Mike [name] is a 63 y.o. male who presents to Urgent Care today for  5 days of runny nose sore throat body aches congestion and mild cough. Patient denies any fevers or chills shortness of breath nausea vomiting or diarrhea. He has tried several over-the-counter medications which have not helped much. He works as a Presenter, broadcasting. No leg swelling or trouble breathing.   Past Medical History  Diagnosis Date  . CHF (congestive heart failure)     Ejection Fraction 25-30%  . Neurofibromatosis   . History of GI bleed   . SOB (shortness of breath)    History  Substance Use Topics  . Smoking status: Never Smoker   . Smokeless tobacco: Not on file  . Alcohol Use: No   ROS as above Medications: No current facility-administered medications for this encounter.   Current Outpatient Prescriptions  Medication Sig Dispense Refill  . atorvastatin (LIPITOR) 20 MG tablet Take 1 tablet (20 mg total) by mouth daily.  90 tablet  3  . carvedilol (COREG) 25 MG tablet TAKE 1 TABLET BY MOUTH TWICE DAILY WITH MEALS  60 tablet  PRN  . ferrous gluconate (FERGON) 324 MG tablet TAKE 1 TABLET BY MOUTH 3 TIMES DAILY WITH MEALS  90 tablet  0  . furosemide (LASIX) 20 MG tablet TAKE 1 TABLET BY MOUTH DAILY.  90 tablet  0  . loratadine (CLARITIN) 10 MG tablet Take 10 mg by mouth daily.      Marland Kitchen losartan (COZAAR) 100 MG tablet TAKE 1 TABLET BY MOUTH DAILY.  90 tablet  PRN  . Multiple Vitamins-Minerals (CENTRUM SILVER PO) Take 1 tablet by mouth daily.       Marland Kitchen azithromycin (ZITHROMAX) 250 MG tablet Take 1 tablet (250 mg total) by mouth daily. Take first 2 tablets together, then 1 every day until finished.  6 tablet  0  . guaiFENesin-codeine 100-10 MG/5ML syrup Take 5 mLs by mouth at bedtime as needed for cough.  120 mL  0  . ipratropium (ATROVENT) 0.06 % nasal spray Place 2 sprays into both nostrils 4 (four) times daily.  15 mL  1  . predniSONE (DELTASONE) 10 MG tablet Take 3 tablets (30 mg total) by mouth daily.  15  tablet  0    Exam:  BP 125/59  Pulse 84  Temp(Src) 98.1 F (36.7 C) (Oral)  Resp 16  SpO2 99%  Gen: Well NAD HEENT: EOMI,  MMM tympanic membranes are normal appearing bilaterally. Posterior pharynx with cobblestoning. Clear nasal discharge. Lungs: Normal work of breathing. CTABL Heart: RRR no MRG Abd: NABS, Soft. NT, ND Exts: Brisk capillary refill, warm and well perfused. Nonedematous bilateral lower extremities.   Assessment and Plan: 63 y.o. male with viral URI. Plan for symptom management with low-dose prednisone, Atrovent nasal spray, codeine containing cough medication. We'll use azithromycin if not improving.  Discussed warning signs or symptoms. Please see discharge instructions. Patient expresses understanding.    Gregor Hams, MD 01/21/14 1055

## 2014-01-28 ENCOUNTER — Other Ambulatory Visit: Payer: Self-pay | Admitting: Cardiovascular Disease

## 2014-03-27 ENCOUNTER — Other Ambulatory Visit: Payer: Self-pay | Admitting: Cardiovascular Disease

## 2014-04-15 ENCOUNTER — Encounter (HOSPITAL_COMMUNITY): Payer: Self-pay | Admitting: Emergency Medicine

## 2014-04-15 ENCOUNTER — Emergency Department (INDEPENDENT_AMBULATORY_CARE_PROVIDER_SITE_OTHER)
Admission: EM | Admit: 2014-04-15 | Discharge: 2014-04-15 | Disposition: A | Discharge status: Home / Self Care | Payer: 59 | Source: Home / Self Care | Attending: Emergency Medicine | Admitting: Emergency Medicine

## 2014-04-15 DIAGNOSIS — J069 Acute upper respiratory infection, unspecified: Secondary | ICD-10-CM

## 2014-04-15 MED ORDER — AZITHROMYCIN 250 MG PO TABS: ORAL_TABLET | ORAL | Status: DC

## 2014-04-15 MED ORDER — IPRATROPIUM BROMIDE 0.06 % NA SOLN: 2.0000 | Freq: Four times a day (QID) | NASAL | Status: DC

## 2014-04-15 MED ORDER — HYDROCOD POLST-CHLORPHEN POLST 10-8 MG/5ML PO LQCR: 5.0000 mL | Freq: Two times a day (BID) | ORAL | Status: DC | PRN

## 2014-04-15 NOTE — ED Notes (Signed)
URI c/o < MD eval only

## 2014-04-15 NOTE — ED Provider Notes (Addendum)
Chief Complaint   No chief complaint on file.   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 He's felt achy and chilled, had some aching in his chest, and sore throat. He denies any fever, GI symptoms, earache, or wheezing. He has a history of congestive heart failure in the past but denies any ankle edema, PND, orthopnea, or shortness of breath he with exertion or at rest. 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  Review of Systems   Other than as noted above, the patient denies any of the following symptoms: Systemic:  No fevers, chills, sweats, or myalgias. Eye:  No redness or discharge. ENT:  No ear pain, headache, nasal congestion, drainage, sinus pressure, or sore throat. Neck:  No neck pain, stiffness, or swollen glands. Lungs:  No cough, sputum production, hemoptysis, wheezing, chest tightness, shortness of breath or chest pain. GI:  No abdominal pain, nausea, vomiting or diarrhea.  [place]   Past medical history, family history, social history, meds, and allergies were reviewed. He has a history of congestive heart failure, neurofibromatosis, and GI bleed. He's allergic to sulfa drugs. Current meds include Lipitor, carvedilol, Lasix, Claritin, and Cozaar.  Physical exam   Vital signs:  BP 134/77  Pulse 70  Temp(Src) 98.6 F (37 C) (Oral)  Resp 16  SpO2 94% General:  Alert and oriented.  In no distress.  Skin warm and dry. Eye:  No conjunctival injection or drainage. Lids were normal. ENT:  TMs and canals were normal, without erythema or inflammation.  Nasal mucosa was clear and uncongested, without drainage.  Mucous membranes were moist.  Pharynx was clear with no exudate or drainage.  There were no oral ulcerations or  lesions. Neck:  Supple, no adenopathy, tenderness or mass. No JVD. Lungs:  No respiratory distress.  Lungs were clear to auscultation, without wheezes, rales or rhonchi.  Breath sounds were clear and equal bilaterally.  Heart:  Regular rhythm, without gallops, murmers or rubs. Extremities: No ankle edema. Skin:  Clear, warm, and dry, without rash. He has multiple neurofibromas covering his entire body.   Assessment     The encounter diagnosis was Viral URI.  No evidence of congestive heart failure.  Plan    1.  Meds:  The following meds were prescribed:   New Prescriptions   AZITHROMYCIN (ZITHROMAX Z-PAK) 250 MG TABLET    Take as directed.   CHLORPHENIRAMINE-HYDROCODONE (TUSSIONEX) 10-8 MG/5ML LQCR    Take 5 mLs by mouth every 12 (twelve) hours as needed for cough.   IPRATROPIUM (ATROVENT) 0.06 % NASAL SPRAY    Place 2 sprays into both nostrils 4 (four) times daily.   The patient was told not to get the prescription for antibiotic filled unless his respiratory symptoms had persisted for more than 7 to 10 days.  2.  Patient Education/Counseling:  The patient was given appropriate handouts, self care instructions, and instructed in symptomatic relief.  Instructed to get extra fluids, rest, and use a cool mist vaporizer.    3.  Follow up:  The patient was told to follow up here if no better in 3 to 4 days, or sooner if becoming worse in any way, and given some red flag symptoms such as increasing fever, difficulty breathing, chest pain,  or persistent vomiting which would prompt immediate return.  Follow up here as needed.      Harden Mo, MD 04/15/14 7001  Harden Mo, MD 04/15/14 [phone]

## 2014-04-15 NOTE — Discharge Instructions (Signed)

## 2014-04-18 ENCOUNTER — Other Ambulatory Visit: Payer: Self-pay | Admitting: Cardiovascular Disease

## 2014-07-14 ENCOUNTER — Other Ambulatory Visit: Payer: Self-pay

## 2014-07-14 MED ORDER — LOSARTAN POTASSIUM 100 MG PO TABS: ORAL_TABLET | ORAL | Status: DC

## 2014-07-30 ENCOUNTER — Other Ambulatory Visit: Payer: Self-pay | Admitting: Cardiovascular Disease

## 2014-08-13 ENCOUNTER — Encounter: Payer: Self-pay | Admitting: Cardiovascular Disease

## 2014-08-13 ENCOUNTER — Ambulatory Visit (INDEPENDENT_AMBULATORY_CARE_PROVIDER_SITE_OTHER): Payer: 59 | Admitting: Cardiovascular Disease

## 2014-08-13 VITALS — BP 116/80 | HR 70 | Ht 72.0 in | Wt 204.0 lb

## 2014-08-13 DIAGNOSIS — I5022 Chronic systolic (congestive) heart failure: Secondary | ICD-10-CM

## 2014-08-13 DIAGNOSIS — R5381 Other malaise: Secondary | ICD-10-CM

## 2014-08-13 DIAGNOSIS — I509 Heart failure, unspecified: Secondary | ICD-10-CM

## 2014-08-13 DIAGNOSIS — E785 Hyperlipidemia, unspecified: Secondary | ICD-10-CM

## 2014-08-13 DIAGNOSIS — R5382 Chronic fatigue, unspecified: Secondary | ICD-10-CM

## 2014-08-13 DIAGNOSIS — R5383 Other fatigue: Secondary | ICD-10-CM | POA: Insufficient documentation

## 2014-08-13 LAB — CBC WITH DIFFERENTIAL/PLATELET
BASOS ABS: 0.1 10*3/uL (ref 0.0–0.1)
Basophils Relative: 0.9 % (ref 0.0–3.0)
Eosinophils Absolute: 0.1 10*3/uL (ref 0.0–0.7)
Eosinophils Relative: 1.1 % (ref 0.0–5.0)
HEMATOCRIT: 45.2 % (ref 39.0–52.0)
HEMOGLOBIN: 15.1 g/dL (ref 13.0–17.0)
LYMPHS ABS: 2.7 10*3/uL (ref 0.7–4.0)
Lymphocytes Relative: 23.5 % (ref 12.0–46.0)
MCHC: 33.4 g/dL (ref 30.0–36.0)
MCV: 89.6 fl (ref 78.0–100.0)
MONO ABS: 1.5 10*3/uL — AB (ref 0.1–1.0)
MONOS PCT: 13 % — AB (ref 3.0–12.0)
NEUTROS ABS: 7 10*3/uL (ref 1.4–7.7)
Neutrophils Relative %: 61.5 % (ref 43.0–77.0)
PLATELETS: 405 10*3/uL — AB (ref 150.0–400.0)
RBC: 5.04 Mil/uL (ref 4.22–5.81)
RDW: 14.4 % (ref 11.5–15.5)
WBC: 11.4 10*3/uL — ABNORMAL HIGH (ref 4.0–10.5)

## 2014-08-13 LAB — BASIC METABOLIC PANEL
BUN: 12 mg/dL (ref 6–23)
CALCIUM: 8.8 mg/dL (ref 8.4–10.5)
CO2: 26 meq/L (ref 19–32)
CREATININE: 0.9 mg/dL (ref 0.4–1.5)
Chloride: 104 mEq/L (ref 96–112)
GFR: 93.65 mL/min (ref >60.00)
Glucose, Bld: 111 mg/dL — ABNORMAL HIGH (ref 70–99)
Potassium: 3.4 mEq/L — ABNORMAL LOW (ref 3.5–5.1)
SODIUM: 140 meq/L (ref 135–145)

## 2014-08-13 LAB — HEPATIC FUNCTION PANEL
ALK PHOS: 80 U/L (ref 39–117)
ALT: 29 U/L (ref 0–53)
AST: 23 U/L (ref 0–37)
Albumin: 3.8 g/dL (ref 3.5–5.2)
BILIRUBIN DIRECT: 0 mg/dL (ref 0.0–0.3)
TOTAL PROTEIN: 7.2 g/dL (ref 6.0–8.3)
Total Bilirubin: 1.1 mg/dL (ref 0.2–1.2)

## 2014-08-13 LAB — LIPID PANEL
CHOL/HDL RATIO: 2
Cholesterol: 149 mg/dL (ref 0–200)
HDL: 67.3 mg/dL (ref >39.00)
LDL Cholesterol: 68 mg/dL (ref 0–99)
NONHDL: 81.7
TRIGLYCERIDES: 67 mg/dL (ref 0.0–149.0)
VLDL: 13.4 mg/dL (ref 0.0–40.0)

## 2014-08-13 LAB — TSH: TSH: 0.61 u[IU]/mL (ref 0.35–4.50)

## 2014-08-13 NOTE — Progress Notes (Signed)
[facility] [address] [place], [place]  [address] [phone]   Fax  [facility] [address] [place], [place]  [address] [phone]  Fax [place] Date of Birth  April 04, 1951       Problem List: 1. Congestive heart failure-original ejection fraction equals 25-30% - improved to 50%.  . minor coronary artery irregularities by heart catheterization 2. Neurofibromatosis 3. Hx of GI bleed   History of Present Illness: Mike [name] is a 63 year old gentleman with a history of congestive heart failure. Cardiac catheterization performed 12/01/2010 reveals mild coronary artery irregularities. His ejection fraction is around 50%. He's not had any episodes of chest pain or shortness of breath.  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    The cost of his Diovan will be increasing after the first of the year. He would like to change to losartan.  July 04, 2013:  Mike [name] is doing well.  He has not had any problems  01/16/2014:  Mike [name] is doing well.  Still works Land at [employer]. He walks several miles a day in the course of his work.    August 13, 2014:  Mike [name] is doing well.  No CP or dyspnea.  No PND or orthopnea.  No DOE   Does stay fatigued.  Does not sleep well at night.   He does not have a medical doctor.       Current Outpatient Prescriptions on File Prior to Visit  Medication Sig Dispense Refill  . atorvastatin (LIPITOR) 20 MG tablet TAKE 1 TABLET BY MOUTH DAILY  90 tablet  0  . carvedilol (COREG) 25 MG tablet TAKE 1 TABLET BY MOUTH TWICE DAILY WITH MEALS  60 tablet  PRN  . ferrous gluconate (FERGON) 324 MG tablet TAKE 1 TABLET BY MOUTH 3 TIMES DAILY WITH MEALS  90 tablet  0  . furosemide (LASIX) 20 MG tablet TAKE 1 TABLET BY MOUTH DAILY.  90 tablet  PRN  . loratadine (CLARITIN) 10 MG tablet Take 10 mg by mouth daily.      Marland Kitchen losartan (COZAAR) 100 MG tablet TAKE 1 TABLET BY  MOUTH DAILY.  90 tablet  1  . Multiple Vitamins-Minerals (CENTRUM SILVER PO) Take 1 tablet by mouth daily.        No current facility-administered medications on file prior to visit.    Allergies  Allergen Reactions  . Sulfonamide Derivatives Hives    Past Medical History  Diagnosis Date  . CHF (congestive heart failure)     Ejection Fraction 25-30%  . Neurofibromatosis   . History of GI bleed   . SOB (shortness of breath)     Past Surgical History  Procedure Laterality Date  . Other surgical history      Splenectomy  . Esophagogastroduodenoscopy  08/14/2007    With epinepherine and bipolar electrocoagulation therapy    History  Smoking status  . Never Smoker   Smokeless tobacco  . Not on file    History  Alcohol Use No    Family History  Problem Relation Age of Onset  . Cancer Mother   . Stroke Father     Reviw of Systems:  Reviewed in the HPI.  All other systems are negative.  Physical Exam: Blood pressure 116/80, pulse 70, height 6' (1.829 m), weight 204 lb (92.534 kg). General: Well developed, well nourished, in no acute distress.  Multiple neurofibromas  Head: Normocephalic, atraumatic, sclera non-icteric, mucus membranes are moist,   Neck: Supple. Carotids are 2 + without bruits. No JVD  Lungs: Clear bilaterally to auscultation.  Heart: regular rate.  normal  S1 S2. No murmurs, gallops or rubs.  Abdomen: Soft, non-tender, non-distended with normal bowel sounds. No hepatomegaly. No rebound/guarding. No masses.  Msk:  Strength and tone are normal  Extremities: No clubbing or cyanosis. No edema.  Distal pedal pulses are 2+ and equal bilaterally.  Neuro: Alert and oriented X 3. Moves all extremities spontaneously.  Psych:  Responds to questions appropriately with a normal affect.  ECG:  Assessment / Plan:

## 2014-08-13 NOTE — Patient Instructions (Signed)
Your physician recommends that you have lab work:  TODAY - thyroid, CBC, lipids, liver  Your physician recommends that you continue on your current medications as directed. Please refer to the Current Medication list given to you today.  Your physician wants you to follow-up in: 6 months with Dr. Acie Fredrickson.  You will receive a reminder letter in the mail two months in advance. If you don't receive a letter, please call our office to schedule the follow-up appointment.

## 2014-08-13 NOTE — Assessment & Plan Note (Signed)
Mike [name]'s  congestive heart failure seems to be very well controlled. He's not having any chest pain, shortness breath, dyspnea on exertion. He does have some fatigue which does not seem to be caused by his heart failure. We contemplated doing an echocardiogram but will check labs first. I will certainly be happy to order an echo if we aren't able to find any cause of his general fatigue. I suspect it is because he is  not sleeping well.  We will get labs today including lipids, liver, basic metabolic profile.  We'll also get a TSH and CBC because of his fatigue. I'll see him again in 6 months for followup visit.

## 2014-08-13 NOTE — Assessment & Plan Note (Signed)
We'll check a CBC and TSH today. He needs to followup with a general medical Dr.

## 2014-08-14 ENCOUNTER — Other Ambulatory Visit: Payer: Self-pay | Admitting: *Deleted

## 2014-08-14 DIAGNOSIS — E876 Hypokalemia: Secondary | ICD-10-CM

## 2014-08-14 MED ORDER — POTASSIUM CHLORIDE ER 10 MEQ PO TBCR: 10.0000 meq | EXTENDED_RELEASE_TABLET | Freq: Every day | ORAL | Status: DC

## 2014-09-13 ENCOUNTER — Other Ambulatory Visit: Payer: 59

## 2014-09-19 ENCOUNTER — Other Ambulatory Visit (INDEPENDENT_AMBULATORY_CARE_PROVIDER_SITE_OTHER): Payer: 59 | Admitting: *Deleted

## 2014-09-19 DIAGNOSIS — E876 Hypokalemia: Secondary | ICD-10-CM

## 2014-09-19 LAB — BASIC METABOLIC PANEL
BUN: 14 mg/dL (ref 6–23)
CALCIUM: 9.1 mg/dL (ref 8.4–10.5)
CO2: 26 mEq/L (ref 19–32)
Chloride: 106 mEq/L (ref 96–112)
Creatinine, Ser: 1 mg/dL (ref 0.4–1.5)
GFR: 83.56 mL/min (ref >60.00)
GLUCOSE: 95 mg/dL (ref 70–99)
Potassium: 4 mEq/L (ref 3.5–5.1)
SODIUM: 138 meq/L (ref 135–145)

## 2014-09-20 ENCOUNTER — Telehealth: Payer: Self-pay | Admitting: Cardiovascular Disease

## 2014-09-20 NOTE — Telephone Encounter (Signed)
New message    Patient returning triage call back

## 2014-09-20 NOTE — Telephone Encounter (Signed)
I spoke with pt & he is aware of his lab results Horton Chin RN

## 2014-10-29 ENCOUNTER — Other Ambulatory Visit: Payer: Self-pay | Admitting: Cardiovascular Disease

## 2014-10-30 ENCOUNTER — Other Ambulatory Visit: Payer: Self-pay

## 2015-01-16 ENCOUNTER — Other Ambulatory Visit: Payer: Self-pay | Admitting: Cardiovascular Disease

## 2015-02-10 ENCOUNTER — Encounter: Payer: Self-pay | Admitting: Cardiovascular Disease

## 2015-02-10 ENCOUNTER — Ambulatory Visit (INDEPENDENT_AMBULATORY_CARE_PROVIDER_SITE_OTHER): Payer: 59 | Admitting: Cardiovascular Disease

## 2015-02-10 VITALS — BP 116/80 | HR 70 | Ht 72.0 in | Wt 210.8 lb

## 2015-02-10 DIAGNOSIS — I5022 Chronic systolic (congestive) heart failure: Secondary | ICD-10-CM | POA: Diagnosis not present

## 2015-02-10 DIAGNOSIS — E785 Hyperlipidemia, unspecified: Secondary | ICD-10-CM | POA: Diagnosis not present

## 2015-02-10 LAB — BASIC METABOLIC PANEL
BUN: 14 mg/dL (ref 6–23)
CHLORIDE: 106 meq/L (ref 96–112)
CO2: 28 meq/L (ref 19–32)
Calcium: 9.1 mg/dL (ref 8.4–10.5)
Creatinine, Ser: 0.88 mg/dL (ref 0.40–1.50)
GFR: 92.28 mL/min (ref >60.00)
Glucose, Bld: 117 mg/dL — ABNORMAL HIGH (ref 70–99)
Potassium: 4.5 mEq/L (ref 3.5–5.1)
Sodium: 139 mEq/L (ref 135–145)

## 2015-02-10 LAB — HEPATIC FUNCTION PANEL
ALBUMIN: 3.8 g/dL (ref 3.5–5.2)
ALT: 33 U/L (ref 0–53)
AST: 25 U/L (ref 0–37)
Alkaline Phosphatase: 69 U/L (ref 39–117)
Bilirubin, Direct: 0.1 mg/dL (ref 0.0–0.3)
TOTAL PROTEIN: 6.6 g/dL (ref 6.0–8.3)
Total Bilirubin: 0.7 mg/dL (ref 0.2–1.2)

## 2015-02-10 LAB — LIPID PANEL
CHOLESTEROL: 147 mg/dL (ref 0–200)
HDL: 60.4 mg/dL (ref >39.00)
LDL CALC: 74 mg/dL (ref 0–99)
NonHDL: 86.6
Total CHOL/HDL Ratio: 2
Triglycerides: 63 mg/dL (ref 0.0–149.0)
VLDL: 12.6 mg/dL (ref 0.0–40.0)

## 2015-02-10 NOTE — Patient Instructions (Signed)
Your physician recommends that you have lab work:  TODAY - fasting cholesterol, liver and basic metabolic panel  Your physician recommends that you continue on your current medications as directed. Please refer to the Current Medication list given to you today.  Your physician wants you to follow-up in: 6 months with Dr. Acie Fredrickson.  You will receive a reminder letter in the mail two months in advance. If you don't receive a letter, please call our office to schedule the follow-up appointment.

## 2015-02-10 NOTE — Progress Notes (Signed)
Cardiology Office Note   Date:  02/10/2015   ID:  Connell, Bognar 01-Apr-1951, MRN [id number]  PCP:  No PCP Per Patient  Cardiologist:   Acie Fredrickson Wonda Cheng, MD   Chief Complaint  Patient presents with  . Follow-up     CHF   1. Congestive heart failure-original ejection fraction equals 25-30% - improved to 50%. . minor coronary artery irregularities by heart catheterization 2. Neurofibromatosis 3. Hx of GI bleed   History of Present Illness: Gershon Mussel is a 64 year old gentleman with a history of congestive heart failure. Cardiac catheterization performed 12/01/2010 reveals mild coronary artery irregularities. His ejection fraction is around 50%. He's not had any episodes of chest pain or shortness of breath.  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   The cost of his Diovan will be increasing after the first of the year. He would like to change to losartan.  July 04, 2013:  Gershon Mussel is doing well. He has not had any problems  01/16/2014:  Gershon Mussel is doing well. 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   August 13, 2014:  Gershon Mussel is doing well. No CP or dyspnea. No PND or orthopnea. No DOE Does stay fatigued. Does not sleep well at night. He does not have a medical doctor   History of Present Illness: GARI HARTSELL is a 64 y.o. male who presents for follow up of his CHF  He remains quite active. He works as a Presenter, broadcasting at [employer]. He walks many miles throughout the course of his workday. He denies any chest pain or shortness breath.   Past Medical History  Diagnosis Date  . CHF (congestive heart failure)     Ejection Fraction 25-30%  . Neurofibromatosis   . History of GI bleed   . SOB (shortness of breath)     Past Surgical History  Procedure Laterality Date  . Other surgical history      Splenectomy  . Esophagogastroduodenoscopy  08/14/2007    With epinepherine and  bipolar electrocoagulation therapy     Current Outpatient Prescriptions  Medication Sig Dispense Refill  . atorvastatin (LIPITOR) 20 MG tablet TAKE 1 TABLET BY MOUTH DAILY 90 tablet 3  . carvedilol (COREG) 25 MG tablet TAKE 1 TABLET BY MOUTH TWICE DAILY WITH MEALS 60 tablet 3  . ferrous gluconate (FERGON) 324 MG tablet TAKE 1 TABLET BY MOUTH 3 TIMES DAILY WITH MEALS (Patient taking differently: TAKE 1 TABLET BY MOUTH 2 TIMES DAILY WITH MEALS) 90 tablet 0  . furosemide (LASIX) 20 MG tablet TAKE 1 TABLET BY MOUTH DAILY. 90 tablet PRN  . loratadine (CLARITIN) 10 MG tablet Take 10 mg by mouth daily.    Marland Kitchen losartan (COZAAR) 100 MG tablet TAKE 1 TABLET BY MOUTH DAILY. 90 tablet 3  . Multiple Vitamins-Minerals (CENTRUM SILVER PO) Take 1 tablet by mouth daily.     . potassium chloride (K-DUR) 10 MEQ tablet Take 1 tablet (10 mEq total) by mouth daily. 90 tablet 3   No current facility-administered medications for this visit.    Allergies:   Sulfonamide derivatives    Social History:  The patient  reports that he has never smoked. He does not have any smokeless tobacco history on file. He reports that he does not drink alcohol or use illicit drugs.   Family History:  The patient's family history includes Cancer in his  mother; Stroke in his father.    ROS:  Please see the history of present illness.    Review of Systems: Constitutional:  denies fever, chills, diaphoresis, appetite change and fatigue.  HEENT: denies photophobia, eye pain, redness, hearing loss, ear pain, congestion, sore throat, rhinorrhea, sneezing, neck pain, neck stiffness and tinnitus.  Respiratory: denies SOB, DOE, cough, chest tightness, and wheezing.  Cardiovascular: denies chest pain, palpitations and leg swelling.  Gastrointestinal: denies nausea, vomiting, abdominal pain, diarrhea, constipation, blood in stool.  Genitourinary: denies dysuria, urgency, frequency, hematuria, flank pain and difficulty urinating.    Musculoskeletal: denies  myalgias, back pain, joint swelling, arthralgias and gait problem.   Skin: denies pallor, rash and wound.  Neurological: denies dizziness, seizures, syncope, weakness, light-headedness, numbness and headaches.   Hematological: denies adenopathy, easy bruising, personal or family bleeding history.  Psychiatric/ Behavioral: denies suicidal ideation, mood changes, confusion, nervousness, sleep disturbance and agitation.       All other systems are reviewed and negative.    PHYSICAL EXAM: VS:  BP 116/80 mmHg  Pulse 70  Ht 6' (1.829 m)  Wt 210 lb 12.8 oz (95.618 kg)  BMI 28.58 kg/m2 , BMI Body mass index is 28.58 kg/(m^2). GEN: Well nourished, well developed, in no acute distress HEENT: normal Neck: no JVD, carotid bruits, or masses Cardiac: RRR; no murmurs, rubs, or gallops,no edema  Respiratory:  clear to auscultation bilaterally, normal work of breathing GI: soft, nontender, nondistended, + BS MS: no deformity or atrophy Skin: warm and dry, no rash Neuro:  Strength and sensation are intact Psych: normal   EKG:  EKG is ordered today. The ekg ordered today demonstrates NSR at 70.  1st degree AV block.  Occasional PVC   Recent Labs: 08/13/2014: ALT 29; Hemoglobin 15.1; Platelets 405.0*; TSH 0.61 09/19/2014: BUN 14; Creatinine 1.0; Potassium 4.0; Sodium 138    Lipid Panel    Component Value Date/Time   CHOL 149 08/13/2014 1030   TRIG 67.0 08/13/2014 1030   HDL 67.30 08/13/2014 1030   CHOLHDL 2 08/13/2014 1030   VLDL 13.4 08/13/2014 1030   LDLCALC 68 08/13/2014 1030      Wt Readings from Last 3 Encounters:  02/10/15 210 lb 12.8 oz (95.618 kg)  08/13/14 204 lb (92.534 kg)  01/16/14 211 lb 12.8 oz (96.072 kg)      Other studies Reviewed: Additional studies/ records that were reviewed today include: . Review of the above records demonstrates:    ASSESSMENT AND PLAN:  1. Congestive heart failure-original ejection fraction equals 25-30% -  improved to 50%. . minor coronary artery irregularities by heart catheterization.  His blood pressure and heart rate are normal. Continue same medications.  2. Neurofibromatosis -   3. Hx of GI bleed  4. Hyperlipidemia: Continue current dose of atorvastatin. We will check labs today.  Current medicines are reviewed at length with the patient today.  The patient does not have concerns regarding medicines.  The following changes have been made:  no change   Disposition:   FU with me in 6 months .    Signed, Elizzie Westergard, Wonda Cheng, MD  02/10/2015 9:50 AM    [place], [place], [place]  [address] Phone: [phone]; Fax: [phone]

## 2015-02-20 ENCOUNTER — Encounter (HOSPITAL_COMMUNITY): Payer: Self-pay | Admitting: Emergency Medicine

## 2015-02-20 ENCOUNTER — Emergency Department (INDEPENDENT_AMBULATORY_CARE_PROVIDER_SITE_OTHER)
Admission: EM | Admit: 2015-02-20 | Discharge: 2015-02-20 | Disposition: A | Discharge status: Home / Self Care | Payer: 59 | Source: Home / Self Care | Attending: Family Medicine | Admitting: Family Medicine

## 2015-02-20 DIAGNOSIS — J069 Acute upper respiratory infection, unspecified: Secondary | ICD-10-CM

## 2015-02-20 DIAGNOSIS — H6093 Unspecified otitis externa, bilateral: Secondary | ICD-10-CM

## 2015-02-20 MED ORDER — NEOMYCIN-POLYMYXIN-HC 3.5-10000-1 OT SUSP: 4.0000 [drp] | Freq: Three times a day (TID) | OTIC | Status: DC

## 2015-02-20 MED ORDER — IPRATROPIUM BROMIDE 0.06 % NA SOLN: 2.0000 | Freq: Four times a day (QID) | NASAL | Status: DC

## 2015-02-20 NOTE — Discharge Instructions (Signed)
Thank you for coming in today. Call or go to the emergency room if you get worse, have trouble breathing, have chest pains, or palpitations.   Otitis Externa Otitis externa is a bacterial or fungal infection of the outer ear canal. This is the area from the eardrum to the outside of the ear. Otitis externa is sometimes called "swimmer's ear." CAUSES  Possible causes of infection include:  Swimming in dirty water.  Moisture remaining in the ear after swimming or bathing.  Mild injury (trauma) to the ear.  Objects stuck in the ear (foreign body).  Cuts or scrapes (abrasions) on the outside of the ear. SIGNS AND SYMPTOMS  The first symptom of infection is often itching in the ear canal. Later signs and symptoms may include swelling and redness of the ear canal, ear pain, and yellowish-white fluid (pus) coming from the ear. The ear pain may be worse when pulling on the earlobe. DIAGNOSIS  Your health care provider will perform a physical exam. A sample of fluid may be taken from the ear and examined for bacteria or fungi. TREATMENT  Antibiotic ear drops are often given for 10 to 14 days. Treatment may also include pain medicine or corticosteroids to reduce itching and swelling. HOME CARE INSTRUCTIONS   Apply antibiotic ear drops to the ear canal as prescribed by your health care provider.  Take medicines only as directed by your health care provider.  If you have diabetes, follow any additional treatment instructions from your health care provider.  Keep all follow-up visits as directed by your health care provider. PREVENTION   Keep your ear dry. Use the corner of a towel to absorb water out of the ear canal after swimming or bathing.  Avoid scratching or putting objects inside your ear. This can damage the ear canal or remove the protective wax that lines the canal. This makes it easier for bacteria and fungi to grow.  Avoid swimming in lakes, polluted water, or poorly chlorinated  pools.  You may use ear drops made of rubbing alcohol and vinegar after swimming. Combine equal parts of white vinegar and alcohol in a bottle. Put 3 or 4 drops into each ear after swimming. SEEK MEDICAL CARE IF:   You have a fever.  Your ear is still red, swollen, painful, or draining pus after 3 days.  Your redness, swelling, or pain gets worse.  You have a severe headache.  You have redness, swelling, pain, or tenderness in the area behind your ear. MAKE SURE YOU:   Understand these instructions.  Will watch your condition.  Will get help right away if you are not doing well or get worse. Document Released: 12/06/2005 Document Revised: 04/22/2014 Document Reviewed: 12/23/2011 [facility] Patient Information 2015 [place], [place]. This information is not intended to replace advice given to you by your health care provider. Make sure you discuss any questions you have with your health care provider.   Upper Respiratory Infection, Adult An upper respiratory infection (URI) is also sometimes known as the common cold. The upper respiratory tract includes the nose, sinuses, throat, trachea, and bronchi. Bronchi are the airways leading to the lungs. Most people improve within 1 week, but symptoms can last up to 2 weeks. A residual cough may last even longer.  CAUSES Many different viruses can infect the tissues lining the upper respiratory tract. The tissues become irritated and inflamed and often become very moist. Mucus production is also common. A cold is contagious. You can easily spread the  virus to others by oral contact. This includes kissing, sharing a glass, coughing, or sneezing. Touching your mouth or nose and then touching a surface, which is then touched by another person, can also spread the virus. SYMPTOMS  Symptoms typically develop 1 to 3 days after you come in contact with a cold virus. Symptoms vary from person to person. They may include:  Runny nose.  Sneezing.  Nasal  congestion.  Sinus irritation.  Sore throat.  Loss of voice (laryngitis).  Cough.  Fatigue.  Muscle aches.  Loss of appetite.  Headache.  Low-grade fever. DIAGNOSIS  You might diagnose your own cold based on familiar symptoms, since most people get a cold 2 to 3 times a year. Your caregiver can confirm this based on your exam. Most importantly, your caregiver can check that your symptoms are not due to another disease such as strep throat, sinusitis, pneumonia, asthma, or epiglottitis. Blood tests, throat tests, and X-rays are not necessary to diagnose a common cold, but they may sometimes be helpful in excluding other more serious diseases. Your caregiver will decide if any further tests are required. RISKS AND COMPLICATIONS  You may be at risk for a more severe case of the common cold if you smoke cigarettes, have chronic heart disease (such as heart failure) or lung disease (such as asthma), or if you have a weakened immune system. The very young and very old are also at risk for more serious infections. Bacterial sinusitis, middle ear infections, and bacterial pneumonia can complicate the common cold. The common cold can worsen asthma and chronic obstructive pulmonary disease (COPD). Sometimes, these complications can require emergency medical care and may be life-threatening. PREVENTION  The best way to protect against getting a cold is to practice good hygiene. Avoid oral or hand contact with people with cold symptoms. Wash your hands often if contact occurs. There is no clear evidence that vitamin C, vitamin E, echinacea, or exercise reduces the chance of developing a cold. However, it is always recommended to get plenty of rest and practice good nutrition. TREATMENT  Treatment is directed at relieving symptoms. There is no cure. Antibiotics are not effective, because the infection is caused by a virus, not by bacteria. Treatment may include:  Increased fluid intake. Sports drinks  offer valuable electrolytes, sugars, and fluids.  Breathing heated mist or steam (vaporizer or shower).  Eating chicken soup or other clear broths, and maintaining good nutrition.  Getting plenty of rest.  Using gargles or lozenges for comfort.  Controlling fevers with ibuprofen or acetaminophen as directed by your caregiver.  Increasing usage of your inhaler if you have asthma. Zinc gel and zinc lozenges, taken in the first 24 hours of the common cold, can shorten the duration and lessen the severity of symptoms. Pain medicines may help with fever, muscle aches, and throat pain. A variety of non-prescription medicines are available to treat congestion and runny nose. Your caregiver can make recommendations and may suggest nasal or lung inhalers for other symptoms.  HOME CARE INSTRUCTIONS   Only take over-the-counter or prescription medicines for pain, discomfort, or fever as directed by your caregiver.  Use a warm mist humidifier or inhale steam from a shower to increase air moisture. This may keep secretions moist and make it easier to breathe.  Drink enough water and fluids to keep your urine clear or pale yellow.  Rest as needed.  Return to work when your temperature has returned to normal or as your caregiver advises.  You may need to stay home longer to avoid infecting others. You can also use a face mask and careful hand washing to prevent spread of the virus. SEEK MEDICAL CARE IF:   After the first few days, you feel you are getting worse rather than better.  You need your caregiver's advice about medicines to control symptoms.  You develop chills, worsening shortness of breath, or brown or red sputum. These may be signs of pneumonia.  You develop yellow or brown nasal discharge or pain in the face, especially when you bend forward. These may be signs of sinusitis.  You develop a fever, swollen neck glands, pain with swallowing, or white areas in the back of your throat.  These may be signs of strep throat. SEEK IMMEDIATE MEDICAL CARE IF:   You have a fever.  You develop severe or persistent headache, ear pain, sinus pain, or chest pain.  You develop wheezing, a prolonged cough, cough up blood, or have a change in your usual mucus (if you have chronic lung disease).  You develop sore muscles or a stiff neck. Document Released: 06/01/2001 Document Revised: 02/28/2012 Document Reviewed: 03/13/2014 [facility] Patient Information 2015 [place], [place]. This information is not intended to replace advice given to you by your health care provider. Make sure you discuss any questions you have with your health care provider.

## 2015-02-20 NOTE — ED Provider Notes (Signed)
Mike [name] is a 64 y.o. male who presents to Urgent Care today for cough congestion runny nose. No fevers chills nausea vomiting or diarrhea. Patient has bilateral ear pain. Symptoms present for around 2 weeks. Patient has a history of neurofibromatosis. Additionally he wears hearing aids. No Chest pain or palpitations or significant shortness of breath.   Past Medical History  Diagnosis Date  . CHF (congestive heart failure)     Ejection Fraction 25-30%  . Neurofibromatosis   . History of GI bleed   . SOB (shortness of breath)    Past Surgical History  Procedure Laterality Date  . Other surgical history      Splenectomy  . Esophagogastroduodenoscopy  08/14/2007    With epinepherine and bipolar electrocoagulation therapy   History  Substance Use Topics  . Smoking status: Never Smoker   . Smokeless tobacco: Not on file  . Alcohol Use: No   ROS as above Medications: No current facility-administered medications for this encounter.   Current Outpatient Prescriptions  Medication Sig Dispense Refill  . atorvastatin (LIPITOR) 20 MG tablet TAKE 1 TABLET BY MOUTH DAILY 90 tablet 3  . carvedilol (COREG) 25 MG tablet TAKE 1 TABLET BY MOUTH TWICE DAILY WITH MEALS 60 tablet 3  . ferrous gluconate (FERGON) 324 MG tablet TAKE 1 TABLET BY MOUTH 3 TIMES DAILY WITH MEALS (Patient taking differently: TAKE 1 TABLET BY MOUTH 2 TIMES DAILY WITH MEALS) 90 tablet 0  . furosemide (LASIX) 20 MG tablet TAKE 1 TABLET BY MOUTH DAILY. 90 tablet PRN  . loratadine (CLARITIN) 10 MG tablet Take 10 mg by mouth daily.    Marland Kitchen losartan (COZAAR) 100 MG tablet TAKE 1 TABLET BY MOUTH DAILY. 90 tablet 3  . Multiple Vitamins-Minerals (CENTRUM SILVER PO) Take 1 tablet by mouth daily.     . potassium chloride (K-DUR) 10 MEQ tablet Take 1 tablet (10 mEq total) by mouth daily. 90 tablet 3  . ipratropium (ATROVENT) 0.06 % nasal spray Place 2 sprays into both nostrils 4 (four) times daily. 15 mL 1  .  neomycin-polymyxin-hydrocortisone (CORTISPORIN) 3.5-10000-1 otic suspension Place 4 drops into both ears 3 (three) times daily. 10 mL 1   Allergies  Allergen Reactions  . Sulfonamide Derivatives Hives     Exam:  BP 134/70 mmHg  Pulse 73  Temp(Src) 98.2 F (36.8 C) (Oral)  Resp 16  SpO2 99% Gen: Well NAD NONTOXIC APPEARING  HEENT: EOMI,  MMM TYMPANIC MEMBRANES ARE NORMAL APPEARING BILATERALLY HOWEVER THE EAR CANALS BILATERALLY ARE MILDLY ERYTHEMATOUS.  Normal posterior pharynx . Clear nasal discharge present  Lungs: Normal work of breathing. CTABL Heart: RRR no MRG Abd: NABS, Soft. Nondistended, Nontender Exts: Brisk capillary refill, warm and well perfused.   No results found for this or any previous visit (from the past 24 hour(s)). No results found.  Assessment and Plan: 64 y.o. male with  viral URI. Treatment with Atrovent nasal spray and Tylenol. Additionally patient has otitis externa likely due to his hearing aids. Use Cortisporin eardrops.  Discussed warning signs or symptoms. Please see discharge instructions. Patient expresses understanding.     Gregor Hams, MD 02/20/15 [phone]

## 2015-02-20 NOTE — ED Notes (Signed)
C/o  Cough.  Runny/stuffy nose.  Bilateral ear pain.  X 2 wks.   Denies fever, n/v/d.   No otc meds tried for symptoms.

## 2015-03-30 ENCOUNTER — Emergency Department (HOSPITAL_BASED_OUTPATIENT_CLINIC_OR_DEPARTMENT_OTHER)
Admission: EM | Admit: 2015-03-30 | Discharge: 2015-03-30 | Disposition: A | Discharge status: Home / Self Care | Payer: 59 | Attending: Emergency Medicine | Admitting: Emergency Medicine

## 2015-03-30 ENCOUNTER — Encounter (HOSPITAL_BASED_OUTPATIENT_CLINIC_OR_DEPARTMENT_OTHER): Payer: Self-pay | Admitting: *Deleted

## 2015-03-30 DIAGNOSIS — H6591 Unspecified nonsuppurative otitis media, right ear: Secondary | ICD-10-CM | POA: Insufficient documentation

## 2015-03-30 DIAGNOSIS — Z87898 Personal history of other specified conditions: Secondary | ICD-10-CM | POA: Diagnosis not present

## 2015-03-30 DIAGNOSIS — H938X9 Other specified disorders of ear, unspecified ear: Secondary | ICD-10-CM | POA: Diagnosis present

## 2015-03-30 DIAGNOSIS — I509 Heart failure, unspecified: Secondary | ICD-10-CM | POA: Insufficient documentation

## 2015-03-30 DIAGNOSIS — Z8719 Personal history of other diseases of the digestive system: Secondary | ICD-10-CM | POA: Insufficient documentation

## 2015-03-30 DIAGNOSIS — Z79899 Other long term (current) drug therapy: Secondary | ICD-10-CM | POA: Insufficient documentation

## 2015-03-30 DIAGNOSIS — Z792 Long term (current) use of antibiotics: Secondary | ICD-10-CM | POA: Diagnosis not present

## 2015-03-30 MED ORDER — AZITHROMYCIN 250 MG PO TABS: ORAL_TABLET | ORAL | Status: DC

## 2015-03-30 NOTE — ED Provider Notes (Signed)
CSN: [id number]     Arrival date & time 03/30/15  0745 History   First MD Initiated Contact with Patient 03/30/15 [phone]     Chief Complaint  Patient presents with  . Ear Fullness     (Consider location/radiation/quality/duration/timing/severity/associated sxs/prior Treatment) HPI Comments: Patient is a 64 year old male who presents for evaluation of ear fullness and irritation for the past week. He was seen several days ago and prescribed Cortisporin drops, however these are not helping. He reports he can hear out of the ear, however he feels as though there is something sloshing around when he moves his head.  Patient is a 64 y.o. male presenting with plugged ear sensation. The history is provided by the patient.  Ear Fullness This is a new problem. Episode onset: one week ago. The problem occurs constantly. The problem has been gradually worsening. Pertinent negatives include no shortness of breath. Nothing aggravates the symptoms. Nothing relieves the symptoms. Treatments tried: Cortisporin and Flonase. The treatment provided no relief.    Past Medical History  Diagnosis Date  . CHF (congestive heart failure)     Ejection Fraction 25-30%  . Neurofibromatosis   . History of GI bleed   . SOB (shortness of breath)    Past Surgical History  Procedure Laterality Date  . Other surgical history      Splenectomy  . Esophagogastroduodenoscopy  08/14/2007    With epinepherine and bipolar electrocoagulation therapy   Family History  Problem Relation Age of Onset  . Cancer Mother   . Stroke Father    History  Substance Use Topics  . Smoking status: Never Smoker   . Smokeless tobacco: Not on file  . Alcohol Use: No    Review of Systems  Respiratory: Negative for shortness of breath.   All other systems reviewed and are negative.     Allergies  Sulfonamide derivatives  Home Medications   Prior to Admission medications   Medication Sig Start Date End Date Taking?  Authorizing Provider  atorvastatin (LIPITOR) 20 MG tablet TAKE 1 TABLET BY MOUTH DAILY 10/30/14   Thayer Headings, MD  carvedilol (COREG) 25 MG tablet TAKE 1 TABLET BY MOUTH TWICE DAILY WITH MEALS 01/17/15   Josue Hector, MD  ferrous gluconate (FERGON) 324 MG tablet TAKE 1 TABLET BY MOUTH 3 TIMES DAILY WITH MEALS Patient taking differently: TAKE 1 TABLET BY MOUTH 2 TIMES DAILY WITH MEALS 10/01/13   Irene Shipper, MD  furosemide (LASIX) 20 MG tablet TAKE 1 TABLET BY MOUTH DAILY. 03/27/14   Thayer Headings, MD  ipratropium (ATROVENT) 0.06 % nasal spray Place 2 sprays into both nostrils 4 (four) times daily. 02/20/15   Gregor Hams, MD  loratadine (CLARITIN) 10 MG tablet Take 10 mg by mouth daily.    Historical Provider, MD  losartan (COZAAR) 100 MG tablet TAKE 1 TABLET BY MOUTH DAILY. 01/17/15   Thayer Headings, MD  Multiple Vitamins-Minerals (CENTRUM SILVER PO) Take 1 tablet by mouth daily.     Historical Provider, MD  neomycin-polymyxin-hydrocortisone (CORTISPORIN) 3.5-10000-1 otic suspension Place 4 drops into both ears 3 (three) times daily. 02/20/15   Gregor Hams, MD  potassium chloride (K-DUR) 10 MEQ tablet Take 1 tablet (10 mEq total) by mouth daily. 08/14/14   Thayer Headings, MD   BP 123/58 mmHg  Pulse 71  Temp(Src) 98.1 F (36.7 C) (Oral)  Resp 20  Ht 6' (1.829 m)  Wt 210 lb (95.255 kg)  BMI 28.47 kg/m2  SpO2 97% Physical Exam  Constitutional: He is oriented to person, place, and time. He appears well-developed and well-nourished. No distress.  HENT:  Head: Normocephalic and atraumatic.  The right TM is contracted with slight erythema and fluid in the middle ear. The ear canal appears well. There is no inflammation or purulent drainage.  Neck: Normal range of motion. Neck supple.  Lymphadenopathy:    He has no cervical adenopathy.  Neurological: He is alert and oriented to person, place, and time.  Skin: Skin is warm and dry. He is not diaphoretic.  Nursing note and vitals  reviewed.   ED Course  Procedures (including critical care time) Labs Review Labs Reviewed - No data to display  Imaging Review No results found.   EKG Interpretation None      MDM   Final diagnoses:  None    This appears to be an otitis media. I will prescribe Zithromax and recommend Sudafed as a decongestant. To follow-up with his primary Dr. if not improving in the next few days.    Veryl Speak, MD 03/30/15 [phone]

## 2015-03-30 NOTE — Discharge Instructions (Signed)
Zithromax as prescribed.  Continue Cortisporin drops as before.  Sudafed as per package instructions for decongestant.   Otitis Media Otitis media is redness, soreness, and inflammation of the middle ear. Otitis media may be caused by allergies or, most commonly, by infection. Often it occurs as a complication of the common cold. SIGNS AND SYMPTOMS Symptoms of otitis media may include:  Earache.  Fever.  Ringing in your ear.  Headache.  Leakage of fluid from the ear. DIAGNOSIS To diagnose otitis media, your health care provider will examine your ear with an otoscope. This is an instrument that allows your health care provider to see into your ear in order to examine your eardrum. Your health care provider also will ask you questions about your symptoms. TREATMENT  Typically, otitis media resolves on its own within 3-5 days. Your health care provider may prescribe medicine to ease your symptoms of pain. If otitis media does not resolve within 5 days or is recurrent, your health care provider may prescribe antibiotic medicines if he or she suspects that a bacterial infection is the cause. HOME CARE INSTRUCTIONS   If you were prescribed an antibiotic medicine, finish it all even if you start to feel better.  Take medicines only as directed by your health care provider.  Keep all follow-up visits as directed by your health care provider. SEEK MEDICAL CARE IF:  You have otitis media only in one ear, or bleeding from your nose, or both.  You notice a lump on your neck.  You are not getting better in 3-5 days.  You feel worse instead of better. SEEK IMMEDIATE MEDICAL CARE IF:   You have pain that is not controlled with medicine.  You have swelling, redness, or pain around your ear or stiffness in your neck.  You notice that part of your face is paralyzed.  You notice that the bone behind your ear (mastoid) is tender when you touch it. MAKE SURE YOU:   Understand these  instructions.  Will watch your condition.  Will get help right away if you are not doing well or get worse. Document Released: 09/10/2004 Document Revised: 04/22/2014 Document Reviewed: 07/03/2013 [facility] Patient Information 2015 [place], [place]. This information is not intended to replace advice given to you by your health care provider. Make sure you discuss any questions you have with your health care provider.

## 2015-03-30 NOTE — ED Notes (Addendum)
Patient states that he has been experiencing right ear irritation for the past two weeks, he states it feels as if something is covering his eardrum. He has been using neomycin/polymyxin eardrops and fluticasone nasal spray for the past week but it still feels the same

## 2015-05-20 ENCOUNTER — Other Ambulatory Visit: Payer: Self-pay | Admitting: Cardiovascular Disease

## 2015-06-19 ENCOUNTER — Other Ambulatory Visit: Payer: Self-pay | Admitting: Cardiovascular Disease

## 2015-08-06 ENCOUNTER — Other Ambulatory Visit: Payer: Self-pay | Admitting: Cardiovascular Disease

## 2015-08-21 ENCOUNTER — Ambulatory Visit (INDEPENDENT_AMBULATORY_CARE_PROVIDER_SITE_OTHER): Payer: 59 | Admitting: Cardiovascular Disease

## 2015-08-21 ENCOUNTER — Encounter: Payer: Self-pay | Admitting: Cardiovascular Disease

## 2015-08-21 VITALS — BP 120/80 | HR 67 | Ht 72.0 in | Wt 204.4 lb

## 2015-08-21 DIAGNOSIS — E785 Hyperlipidemia, unspecified: Secondary | ICD-10-CM | POA: Diagnosis not present

## 2015-08-21 DIAGNOSIS — I5022 Chronic systolic (congestive) heart failure: Secondary | ICD-10-CM | POA: Diagnosis not present

## 2015-08-21 LAB — BASIC METABOLIC PANEL
BUN: 15 mg/dL (ref 6–23)
CO2: 27 meq/L (ref 19–32)
Calcium: 9.1 mg/dL (ref 8.4–10.5)
Chloride: 106 mEq/L (ref 96–112)
Creatinine, Ser: 0.89 mg/dL (ref 0.40–1.50)
GFR: 90.93 mL/min (ref >60.00)
Glucose, Bld: 109 mg/dL — ABNORMAL HIGH (ref 70–99)
Potassium: 4 mEq/L (ref 3.5–5.1)
SODIUM: 141 meq/L (ref 135–145)

## 2015-08-21 LAB — LIPID PANEL
Cholesterol: 157 mg/dL (ref 0–200)
HDL: 63 mg/dL (ref >39.00)
LDL Cholesterol: 82 mg/dL (ref 0–99)
NonHDL: 93.79
Total CHOL/HDL Ratio: 2
Triglycerides: 57 mg/dL (ref 0.0–149.0)
VLDL: 11.4 mg/dL (ref 0.0–40.0)

## 2015-08-21 LAB — HEPATIC FUNCTION PANEL
ALBUMIN: 4.1 g/dL (ref 3.5–5.2)
ALK PHOS: 66 U/L (ref 39–117)
ALT: 29 U/L (ref 0–53)
AST: 20 U/L (ref 0–37)
Bilirubin, Direct: 0.1 mg/dL (ref 0.0–0.3)
Total Bilirubin: 0.6 mg/dL (ref 0.2–1.2)
Total Protein: 7.1 g/dL (ref 6.0–8.3)

## 2015-08-21 NOTE — Patient Instructions (Signed)

## 2015-08-21 NOTE — Progress Notes (Signed)
Cardiology Office Note   Date:  08/21/2015   ID:  BYRAN BILOTTI, DOB [birth year], MRN [id number]  PCP:  No PCP Per Patient  Cardiologist:   Acie Fredrickson Wonda Cheng, MD   Chief Complaint  Patient presents with  . Follow-up    CHF   1. Congestive heart failure-original ejection fraction equals 25-30% - improved to 50%. . minor coronary artery irregularities by heart catheterization 2. Neurofibromatosis 3. Hx of GI bleed   History of Present Illness: Mike [name] is a 64 year old gentleman with a history of congestive heart failure. Cardiac catheterization performed 12/01/2010 reveals mild coronary artery irregularities. His ejection fraction is around 50%. He's not had any episodes of chest pain or shortness of breath.  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   The cost of his Diovan will be increasing after the first of the year. He would like to change to losartan.  July 04, 2013:  Mike [name] is doing well. He has not had any problems  01/16/2014:  Mike [name] is doing well. Still works Land at [employer]. He walks several miles a day in the course of his work.   August 13, 2014:  Mike [name] is doing well. No CP or dyspnea. No PND or orthopnea. No DOE Does stay fatigued. Does not sleep well at night. He does not have a medical doctor    Mike [name] is a 64 y.o. male who presents for follow up of his CHF  He remains quite active. He works as a Presenter, broadcasting at [employer]. He walks many miles throughout the course of his workday. He denies any chest pain or shortness breath.  08/21/2015:  Doing well.  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 No CP , no dyspnea.    Past Medical History  Diagnosis Date  . CHF (congestive heart failure)     Ejection Fraction 25-30%  . Neurofibromatosis   . History of GI bleed   . SOB (shortness of breath)     Past Surgical History  Procedure Laterality Date    . Other surgical history      Splenectomy  . Esophagogastroduodenoscopy  08/14/2007    With epinepherine and bipolar electrocoagulation therapy     Current Outpatient Prescriptions  Medication Sig Dispense Refill  . atorvastatin (LIPITOR) 20 MG tablet TAKE 1 TABLET BY MOUTH DAILY 90 tablet 3  . carvedilol (COREG) 25 MG tablet TAKE 1 TABLET BY MOUTH TWICE DAILY WITH MEALS 60 tablet 3  . ferrous gluconate (FERGON) 324 MG tablet TAKE 1 TABLET BY MOUTH 3 TIMES DAILY WITH MEALS (Patient taking differently: TAKE 1 TABLET BY MOUTH 2 TIMES DAILY WITH MEALS) 90 tablet 0  . furosemide (LASIX) 20 MG tablet TAKE 1 TABLET BY MOUTH DAILY. 90 tablet 3  . loratadine (CLARITIN) 10 MG tablet Take 10 mg by mouth daily.    Marland Kitchen losartan (COZAAR) 100 MG tablet TAKE 1 TABLET BY MOUTH DAILY. 90 tablet 3  . Multiple Vitamins-Minerals (CENTRUM SILVER PO) Take 1 tablet by mouth daily.     . potassium chloride (K-DUR) 10 MEQ tablet Take 1 tablet (10 mEq total) by mouth daily. 90 tablet 3   No current facility-administered medications for this visit.    Allergies:   Sulfonamide derivatives    Social History:  The patient  reports that he has never smoked. He does not have any smokeless tobacco history on file. He  reports that he does not drink alcohol or use illicit drugs.   Family History:  The patient's family history includes Cancer in his mother; Stroke in his father.    ROS:  Please see the history of present illness.    Review of Systems: Constitutional:  denies fever, chills, diaphoresis, appetite change and fatigue.  HEENT: denies photophobia, eye pain, redness, hearing loss, ear pain, congestion, sore throat, rhinorrhea, sneezing, neck pain, neck stiffness and tinnitus.  Respiratory: denies SOB, DOE, cough, chest tightness, and wheezing.  Cardiovascular: denies chest pain, palpitations and leg swelling.  Gastrointestinal: denies nausea, vomiting, abdominal pain, diarrhea, constipation, blood in  stool.  Genitourinary: denies dysuria, urgency, frequency, hematuria, flank pain and difficulty urinating.  Musculoskeletal: denies  myalgias, back pain, joint swelling, arthralgias and gait problem.   Skin: denies pallor, rash and wound.  Neurological: denies dizziness, seizures, syncope, weakness, light-headedness, numbness and headaches.   Hematological: denies adenopathy, easy bruising, personal or family bleeding history.  Psychiatric/ Behavioral: denies suicidal ideation, mood changes, confusion, nervousness, sleep disturbance and agitation.       All other systems are reviewed and negative.    PHYSICAL EXAM: VS:  BP 120/80 mmHg  Pulse 67  Ht 6' (1.829 m)  Wt 92.715 kg (204 lb 6.4 oz)  BMI 27.72 kg/m2  SpO2 96% , BMI Body mass index is 27.72 kg/(m^2). GEN: Well nourished, well developed, in no acute distress HEENT: normal Neck: no JVD, carotid bruits, or masses Cardiac: RRR; no murmurs, rubs, or gallops,no edema  Respiratory:  clear to auscultation bilaterally, normal work of breathing GI: soft, nontender, nondistended, + BS MS: no deformity or atrophy Skin: warm and dry, no rash Neuro:  Strength and sensation are intact Psych: normal   EKG:  EKG is ordered today. The ekg ordered today demonstrates NSR at 70.  1st degree AV block.  Occasional PVC   Recent Labs: 02/10/2015: ALT 33; BUN 14; Creatinine, Ser 0.88; Potassium 4.5; Sodium 139    Lipid Panel    Component Value Date/Time   CHOL 147 02/10/2015 1022   TRIG 63.0 02/10/2015 1022   HDL 60.40 02/10/2015 1022   CHOLHDL 2 02/10/2015 1022   VLDL 12.6 02/10/2015 1022   LDLCALC 74 02/10/2015 1022      Wt Readings from Last 3 Encounters:  08/21/15 92.715 kg (204 lb 6.4 oz)  03/30/15 95.255 kg (210 lb)  02/10/15 95.618 kg (210 lb 12.8 oz)      Other studies Reviewed: Additional studies/ records that were reviewed today include: . Review of the above records demonstrates:    ASSESSMENT AND PLAN:  1.  Congestive heart failure-original ejection fraction equals 25-30% - improved to 50%. . minor coronary artery irregularities by heart catheterization.  His blood pressure and heart rate are normal. Continue same medications.  2. Neurofibromatosis -   3. Hx of GI bleed  4. Hyperlipidemia: Continue current dose of atorvastatin. We will check labs today.  Current medicines are reviewed at length with the patient today.  The patient does not have concerns regarding medicines.  The following changes have been made:  no change   Disposition:   FU with me in 6 months .    Signed, Nocole Zammit, Wonda Cheng, MD  08/21/2015 9:56 AM    [address], [place], [place]  [address] Phone: [phone]; Fax: [phone]

## 2015-09-16 ENCOUNTER — Other Ambulatory Visit: Payer: Self-pay

## 2015-09-16 MED ORDER — CARVEDILOL 25 MG PO TABS: 25.0000 mg | ORAL_TABLET | Freq: Two times a day (BID) | ORAL | Status: DC

## 2015-09-16 NOTE — Telephone Encounter (Signed)
Thayer Headings, MD at 08/21/2015 9:56 AM  carvedilol (COREG) 25 MG tabletTAKE 1 TABLET BY MOUTH TWICE DAILY WITH MEALS Current medicines are reviewed at length with the patient today. The patient does not have concerns regarding medicines. The following changes have been made: no change

## 2015-10-09 ENCOUNTER — Other Ambulatory Visit: Payer: Self-pay | Admitting: Cardiovascular Disease

## 2015-10-30 DIAGNOSIS — H6981 Other specified disorders of Eustachian tube, right ear: Secondary | ICD-10-CM | POA: Diagnosis not present

## 2015-10-30 DIAGNOSIS — H903 Sensorineural hearing loss, bilateral: Secondary | ICD-10-CM | POA: Diagnosis not present

## 2015-12-16 ENCOUNTER — Other Ambulatory Visit: Payer: Self-pay | Admitting: Cardiovascular Disease

## 2016-01-01 MED FILL — LOSARTAN POTASSIUM 100 MG T: 100 | 90 days supply | Qty: 90 | Fill #0

## 2016-01-05 MED FILL — ATORVASTATIN 20 MG TABLET: 20 | 90 days supply | Qty: 90 | Fill #1 | Status: TO

## 2016-01-29 MED FILL — POTASSIUM CL ER 10 MEQ TAB: 10 | 90 days supply | Qty: 90 | Fill #2 | Status: TO

## 2016-02-19 MED FILL — FUROSEMIDE 20 MG TABLET: 20 | 90 days supply | Qty: 90 | Fill #3

## 2016-02-25 ENCOUNTER — Encounter: Payer: Self-pay | Admitting: Cardiovascular Disease

## 2016-02-25 ENCOUNTER — Ambulatory Visit (INDEPENDENT_AMBULATORY_CARE_PROVIDER_SITE_OTHER): Payer: Medicare Other | Admitting: Cardiovascular Disease

## 2016-02-25 VITALS — BP 120/70 | HR 75 | Ht 72.0 in | Wt 202.0 lb

## 2016-02-25 DIAGNOSIS — E785 Hyperlipidemia, unspecified: Secondary | ICD-10-CM | POA: Diagnosis not present

## 2016-02-25 DIAGNOSIS — I5022 Chronic systolic (congestive) heart failure: Secondary | ICD-10-CM | POA: Diagnosis not present

## 2016-02-25 DIAGNOSIS — I509 Heart failure, unspecified: Secondary | ICD-10-CM | POA: Diagnosis not present

## 2016-02-25 LAB — LIPID PANEL
CHOLESTEROL: 152 mg/dL (ref 125–200)
HDL: 72 mg/dL (ref >=40)
LDL Cholesterol: 68 mg/dL (ref <130)
TRIGLYCERIDES: 61 mg/dL (ref <150)
Total CHOL/HDL Ratio: 2.1 Ratio (ref <=5.0)
VLDL: 12 mg/dL (ref <30)

## 2016-02-25 LAB — COMPREHENSIVE METABOLIC PANEL
ALBUMIN: 3.9 g/dL (ref 3.6–5.1)
ALK PHOS: 70 U/L (ref 40–115)
ALT: 32 U/L (ref 9–46)
AST: 24 U/L (ref 10–35)
BILIRUBIN TOTAL: 0.7 mg/dL (ref 0.2–1.2)
BUN: 16 mg/dL (ref 7–25)
CO2: 25 mmol/L (ref 20–31)
CREATININE: 1.04 mg/dL (ref 0.70–1.25)
Calcium: 9.2 mg/dL (ref 8.6–10.3)
Chloride: 105 mmol/L (ref 98–110)
Glucose, Bld: 111 mg/dL — ABNORMAL HIGH (ref 65–99)
Potassium: 4.8 mmol/L (ref 3.5–5.3)
SODIUM: 141 mmol/L (ref 135–146)
TOTAL PROTEIN: 6.6 g/dL (ref 6.1–8.1)

## 2016-02-25 NOTE — Progress Notes (Signed)
Cardiology Office Note   Date:  02/25/2016   ID:  KEPLER GIANINO, DOB [birth year], MRN [id number]  PCP:  No PCP Per Patient  Cardiologist:   Acie Fredrickson Wonda Cheng, MD   Chief Complaint  Patient presents with  . Follow-up    no sx   1. Congestive heart failure-original ejection fraction equals 25-30% - improved to 50%. . minor coronary artery irregularities by heart catheterization 2. Neurofibromatosis 3. Hx of GI bleed   History of Present Illness: Mike [name] is a 65 year old gentleman with a history of congestive heart failure. Cardiac catheterization performed 12/01/2010 reveals mild coronary artery irregularities. His ejection fraction is around 50%. He's not had any episodes of chest pain or shortness of breath.  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   The cost of his Diovan will be increasing after the first of the year. He would like to change to losartan.  July 04, 2013:  Mike [name] is doing well. He has not had any problems  01/16/2014:  Mike [name] is doing well. 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   August 13, 2014:  Mike [name] is doing well. No CP or dyspnea. No PND or orthopnea. No DOE Does stay fatigued. Does not sleep well at night. He does not have a medical doctor    Mike [name] is a 65 y.o. male who presents for follow up of his CHF  He remains quite active. He works as a Presenter, broadcasting at [employer]. He walks many miles throughout the course of his workday. He denies any chest pain or shortness breath.  08/21/2015:  Doing well.  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 No CP , no dyspnea.   February 25, 2016:  Mike [name] is doing  Well. 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  Is planning on retiring in June .   Wife  [name]) retired recently .     Past Medical History  Diagnosis Date  .  CHF (congestive heart failure) (HCC)     Ejection Fraction 25-30%  . Neurofibromatosis   . History of GI bleed   . SOB (shortness of breath)     Past Surgical History  Procedure Laterality Date  . Other surgical history      Splenectomy  . Esophagogastroduodenoscopy  08/14/2007    With epinepherine and bipolar electrocoagulation therapy     Current Outpatient Prescriptions  Medication Sig Dispense Refill  . atorvastatin (LIPITOR) 20 MG tablet TAKE 1 TABLET BY MOUTH DAILY 90 tablet 3  . carvedilol (COREG) 25 MG tablet Take 1 tablet (25 mg total) by mouth 2 (two) times daily with a meal. 180 tablet 1  . ferrous gluconate (FERGON) 324 MG tablet Take 324 mg by mouth 2 (two) times daily with a meal.    . furosemide (LASIX) 20 MG tablet TAKE 1 TABLET BY MOUTH DAILY. 90 tablet 3  . loratadine (CLARITIN) 10 MG tablet Take 10 mg by mouth daily.    Marland Kitchen losartan (COZAAR) 100 MG tablet TAKE 1 TABLET BY MOUTH DAILY. 90 tablet 0  . Multiple Vitamins-Minerals (CENTRUM SILVER PO) Take 1 tablet by mouth daily.     . potassium chloride (K-DUR) 10 MEQ tablet Take 1 tablet (10 mEq total) by mouth daily. 90 tablet 3  No current facility-administered medications for this visit.    Allergies:   Sulfonamide derivatives    Social History:  The patient  reports that he has never smoked. He does not have any smokeless tobacco history on file. He reports that he does not drink alcohol or use illicit drugs.   Family History:  The patient's family history includes Cancer in his mother; Stroke in his father.    ROS:  Please see the history of present illness.    Review of Systems: Constitutional:  denies fever, chills, diaphoresis, appetite change and fatigue.  HEENT: denies photophobia, eye pain, redness, hearing loss, ear pain, congestion, sore throat, rhinorrhea, sneezing, neck pain, neck stiffness and tinnitus.  Respiratory: denies SOB, DOE, cough, chest tightness, and wheezing.  Cardiovascular:  denies chest pain, palpitations and leg swelling.  Gastrointestinal: denies nausea, vomiting, abdominal pain, diarrhea, constipation, blood in stool.  Genitourinary: denies dysuria, urgency, frequency, hematuria, flank pain and difficulty urinating.  Musculoskeletal: denies  myalgias, back pain, joint swelling, arthralgias and gait problem.   Skin: denies pallor, rash and wound.  Neurological: denies dizziness, seizures, syncope, weakness, light-headedness, numbness and headaches.   Hematological: denies adenopathy, easy bruising, personal or family bleeding history.  Psychiatric/ Behavioral: denies suicidal ideation, mood changes, confusion, nervousness, sleep disturbance and agitation.       All other systems are reviewed and negative.    PHYSICAL EXAM: VS:  BP 120/70 mmHg  Pulse 75  Ht 6' (1.829 m)  Wt 202 lb (91.627 kg)  BMI 27.39 kg/m2  SpO2 96% , BMI Body mass index is 27.39 kg/(m^2). GEN: Well nourished, well developed, in no acute distress HEENT: normal Neck: no JVD, carotid bruits, or masses Cardiac: RRR; no murmurs, rubs, or gallops,no edema  Respiratory:  clear to auscultation bilaterally, normal work of breathing GI: soft, nontender, nondistended, + BS MS: no deformity or atrophy Skin: warm and dry, no rash Neuro:  Strength and sensation are intact Psych: normal   EKG:  EKG is ordered today. The ekg ordered today demonstrates NSR at 73.  1st degree AV block.  Occasional PVC.  poor R wave progression  Tries to avoid excessive salt   Recent Labs: 08/21/2015: ALT 29; BUN 15; Creatinine, Ser 0.89; Potassium 4.0; Sodium 141    Lipid Panel    Component Value Date/Time   CHOL 157 08/21/2015 1014   TRIG 57.0 08/21/2015 1014   HDL 63.00 08/21/2015 1014   CHOLHDL 2 08/21/2015 1014   VLDL 11.4 08/21/2015 1014   LDLCALC 82 08/21/2015 1014      Wt Readings from Last 3 Encounters:  02/25/16 202 lb (91.627 kg)  08/21/15 204 lb 6.4 oz (92.715 kg)  03/30/15 210 lb  (95.255 kg)      Other studies Reviewed: Additional studies/ records that were reviewed today include: . Review of the above records demonstrates:    ASSESSMENT AND PLAN:  1. Congestive heart failure-original ejection fraction equals 25-30% - improved to 50%.  minor coronary artery irregularities by heart catheterization.  His blood pressure and heart rate are normal. Continue same medications.  2. Neurofibromatosis -   3. Hx of GI bleed - no signs of bleeding   4. Hyperlipidemia: Continue current dose of atorvastatin. We will check labs today.  Current medicines are reviewed at length with the patient today.  The patient does not have concerns regarding medicines.  The following changes have been made:  no change   Disposition:   FU with me in 6 months .  Signed, Nahser, Wonda Cheng, MD  02/25/2016 9:19 AM    [address], [place], [place]  [address] Phone: [phone]; Fax: [phone]

## 2016-02-25 NOTE — Patient Instructions (Signed)

## 2016-03-24 ENCOUNTER — Other Ambulatory Visit: Payer: Self-pay | Admitting: *Deleted

## 2016-03-24 MED ORDER — CARVEDILOL 25 MG PO TABS: 25.0000 mg | ORAL_TABLET | Freq: Two times a day (BID) | ORAL | Status: DC

## 2016-03-24 MED FILL — CARVEDILOL 25 MG TABLET: 25 | 90 days supply | Qty: 180 | Fill #0 | Status: TO

## 2016-04-08 ENCOUNTER — Other Ambulatory Visit: Payer: Self-pay | Admitting: Cardiovascular Disease

## 2016-04-08 MED FILL — LOSARTAN POTASSIUM 100 MG T: 100 | 90 days supply | Qty: 90 | Fill #0 | Status: TO

## 2016-04-08 MED FILL — ATORVASTATIN 20 MG TABLET: 20 | 90 days supply | Qty: 90 | Fill #2 | Status: TO

## 2016-05-05 ENCOUNTER — Other Ambulatory Visit: Payer: Self-pay | Admitting: Nurse Practitioner

## 2016-05-05 MED ORDER — FUROSEMIDE 20 MG PO TABS: 20.0000 mg | ORAL_TABLET | Freq: Every day | ORAL | Status: DC

## 2016-08-03 ENCOUNTER — Other Ambulatory Visit: Payer: Self-pay | Admitting: *Deleted

## 2016-08-03 NOTE — Telephone Encounter (Signed)
Patient would like to pick up printed scripts to take with him to his appointment in [facility]. He would like to pick these up tomorrow or Thursday as his appointment is Friday. Thanks, MI

## 2016-08-04 ENCOUNTER — Telehealth: Payer: Self-pay | Admitting: Cardiovascular Disease

## 2016-08-04 ENCOUNTER — Encounter: Payer: Self-pay | Admitting: Cardiovascular Disease

## 2016-08-04 MED ORDER — ATORVASTATIN CALCIUM 20 MG PO TABS: 20.0000 mg | ORAL_TABLET | Freq: Every day | ORAL | 3 refills | Status: DC

## 2016-08-04 MED ORDER — POTASSIUM CHLORIDE ER 10 MEQ PO TBCR: 10.0000 meq | EXTENDED_RELEASE_TABLET | Freq: Every day | ORAL | 3 refills | Status: DC

## 2016-08-04 MED ORDER — FUROSEMIDE 20 MG PO TABS: 20.0000 mg | ORAL_TABLET | Freq: Every day | ORAL | 3 refills | Status: DC

## 2016-08-04 NOTE — Telephone Encounter (Signed)
Mike [name] is calling to see if the written prescriptions are ready for him to pick up . Please call   Thanks

## 2016-08-04 NOTE — Telephone Encounter (Signed)
Patient aware that refills have been printed and placed up front for pick up.  He thanked me for the call.

## 2016-08-12 ENCOUNTER — Telehealth: Payer: Self-pay | Admitting: Cardiovascular Disease

## 2016-08-12 NOTE — Telephone Encounter (Signed)
Pt states Dr Acie Fredrickson had prescribed claritin in the past for sinus congestion and runny nose. Pt states it does not seem to be working as well as it has in the past, pt asking if Dr Acie Fredrickson would prescribe something else.  Pt advised to try robitussin prn, tylenol prn, saline nasal spray, a different antihistamine. Pt advised if he does not have any improvement in symptoms he should contact his PCP. Pt verbalized understanding.

## 2016-08-12 NOTE — Telephone Encounter (Signed)
New message       Pt states that he has "sinus congestion", runny nose and a little cough.  What can he take for this that will not interfere with his heart medication

## 2016-08-30 ENCOUNTER — Ambulatory Visit: Payer: Medicare Other | Admitting: Cardiovascular Disease

## 2016-09-09 ENCOUNTER — Encounter: Payer: Self-pay | Admitting: Cardiovascular Disease

## 2016-09-27 ENCOUNTER — Encounter (INDEPENDENT_AMBULATORY_CARE_PROVIDER_SITE_OTHER): Payer: Self-pay

## 2016-09-27 ENCOUNTER — Ambulatory Visit (INDEPENDENT_AMBULATORY_CARE_PROVIDER_SITE_OTHER): Payer: Medicare Other | Admitting: Cardiovascular Disease

## 2016-09-27 ENCOUNTER — Encounter: Payer: Self-pay | Admitting: Cardiovascular Disease

## 2016-09-27 VITALS — BP 90/70 | HR 84 | Ht 72.0 in | Wt 191.8 lb

## 2016-09-27 DIAGNOSIS — I5022 Chronic systolic (congestive) heart failure: Secondary | ICD-10-CM | POA: Diagnosis not present

## 2016-09-27 NOTE — Progress Notes (Signed)
Cardiology Office Note   Date:  09/27/2016   ID:  Mike [name], DOB 25-Oct-1951, MRN [id number]  PCP:  No PCP Per Patient  Cardiologist:   Mertie Moores, MD   Chief Complaint  Patient presents with  . Congestive Heart Failure   1. Congestive heart failure-original ejection fraction equals 25-30% - improved to 50%. . minor coronary artery irregularities by heart catheterization 2. Neurofibromatosis 3. Hx of GI bleed   History of Present Illness: Mike [name] is a 65 year old gentleman with a history of congestive heart failure. Cardiac catheterization performed 12/01/2010 reveals mild coronary artery irregularities. His ejection fraction is around 50%. He's not had any episodes of chest pain or shortness of breath.  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   The cost of his Diovan will be increasing after the first of the year. He would like to change to losartan.  July 04, 2013:  Mike [name] is doing well. He has not had any problems  01/16/2014:  Mike [name] is doing well. 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   August 13, 2014:  Mike [name] is doing well. No CP or dyspnea. No PND or orthopnea. No DOE Does stay fatigued. Does not sleep well at night. He does not have a medical doctor    Mike [name] is a 65 y.o. male who presents for follow up of his CHF  He remains quite active. He works as a Presenter, broadcasting at [employer]. He walks many miles throughout the course of his workday. He denies any chest pain or shortness breath.  08/21/2015:  Doing well.  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 No CP , no dyspnea.   February 25, 2016:  Mike [name] is doing  Well. 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  Is planning on retiring in June .   Wife  [name]) retired recently .  Oct. 9, 2017:  Mike [name] has retired since I last  saw him Not exercising as much   Breathing is good.   No CP    Past Medical History:  Diagnosis Date  . CHF (congestive heart failure) (HCC)    Ejection Fraction 25-30%  . History of GI bleed   . Neurofibromatosis   . SOB (shortness of breath)     Past Surgical History:  Procedure Laterality Date  . ESOPHAGOGASTRODUODENOSCOPY  08/14/2007   With epinepherine and bipolar electrocoagulation therapy  . OTHER SURGICAL HISTORY     Splenectomy     Current Outpatient Prescriptions  Medication Sig Dispense Refill  . atorvastatin (LIPITOR) 20 MG tablet Take 1 tablet (20 mg total) by mouth daily. 90 tablet 3  . carvedilol (COREG) 25 MG tablet Take 1 tablet (25 mg total) by mouth 2 (two) times daily with a meal. 180 tablet 3  . ferrous gluconate (FERGON) 324 MG tablet Take 324 mg by mouth 2 (two) times daily with a meal.    . furosemide (LASIX) 20 MG tablet Take 1 tablet (20 mg total) by mouth daily. 90 tablet 3  . loratadine (CLARITIN) 10 MG tablet Take 10 mg by mouth daily.    Marland Kitchen losartan (COZAAR) 100 MG tablet TAKE 1 TABLET BY MOUTH DAILY. 90 tablet 3  . Multiple Vitamins-Minerals (CENTRUM SILVER PO) Take 1 tablet by mouth daily.     Marland Kitchen  potassium chloride (K-DUR) 10 MEQ tablet Take 1 tablet (10 mEq total) by mouth daily. 90 tablet 3   No current facility-administered medications for this visit.     Allergies:   Sulfonamide derivatives    Social History:  The patient  reports that he has never smoked. He has never used smokeless tobacco. He reports that he does not drink alcohol or use drugs.   Family History:  The patient's family history includes Cancer in his mother; Stroke in his father.    ROS:  Please see the history of present illness.    Review of Systems: Constitutional:  denies fever, chills, diaphoresis, appetite change and fatigue.  HEENT: denies photophobia, eye pain, redness, hearing loss, ear pain, congestion, sore throat, rhinorrhea, sneezing, neck pain, neck  stiffness and tinnitus.  Respiratory: denies SOB, DOE, cough, chest tightness, and wheezing.  Cardiovascular: denies chest pain, palpitations and leg swelling.  Gastrointestinal: denies nausea, vomiting, abdominal pain, diarrhea, constipation, blood in stool.  Genitourinary: denies dysuria, urgency, frequency, hematuria, flank pain and difficulty urinating.  Musculoskeletal: denies  myalgias, back pain, joint swelling, arthralgias and gait problem.   Skin: denies pallor, rash and wound.  Neurological: denies dizziness, seizures, syncope, weakness, light-headedness, numbness and headaches.   Hematological: denies adenopathy, easy bruising, personal or family bleeding history.  Psychiatric/ Behavioral: denies suicidal ideation, mood changes, confusion, nervousness, sleep disturbance and agitation.       All other systems are reviewed and negative.    PHYSICAL EXAM: VS:  BP 90/70 (BP Location: Right Arm, Patient Position: Sitting, Cuff Size: Normal)   Pulse 84   Ht 6' (1.829 m)   Wt 191 lb 12.8 oz (87 kg)   SpO2 99%   BMI 26.01 kg/m  , BMI Body mass index is 26.01 kg/m. GEN: Well nourished, well developed, in no acute distress  HEENT: normal  Neck: no JVD, carotid bruits, or masses Cardiac: RRR; no murmurs, rubs, or gallops,no edema  Respiratory:  clear to auscultation bilaterally, normal work of breathing GI: soft, nontender, nondistended, + BS MS: no deformity or atrophy  Skin: warm and dry, no rash Neuro:  Strength and sensation are intact Psych: normal   EKG:  EKG is ordered today. The ekg ordered today demonstrates NSR at 73.  1st degree AV block.  Occasional PVC.  poor R wave progression  Tries to avoid excessive salt   Recent Labs: 02/25/2016: ALT 32; BUN 16; Creat 1.04; Potassium 4.8; Sodium 141    Lipid Panel    Component Value Date/Time   CHOL 152 02/25/2016 0932   TRIG 61 02/25/2016 0932   HDL 72 02/25/2016 0932   CHOLHDL 2.1 02/25/2016 0932   VLDL 12  02/25/2016 0932   LDLCALC 68 02/25/2016 0932      Wt Readings from Last 3 Encounters:  09/27/16 191 lb 12.8 oz (87 kg)  02/25/16 202 lb (91.6 kg)  08/21/15 204 lb 6.4 oz (92.7 kg)      Other studies Reviewed: Additional studies/ records that were reviewed today include: . Review of the above records demonstrates:    ASSESSMENT AND PLAN:  1. Congestive heart failure-original ejection fraction equals 25-30% - improved to 50%.  minor coronary artery irregularities by heart catheterization.  His blood pressure and heart rate are normal. Continue same medications.  2. Neurofibromatosis -   3. Hx of GI bleed - no signs of bleeding   4. Hyperlipidemia: will check labs at next visit   Current medicines are reviewed at length  with the patient today.  The patient does not have concerns regarding medicines.  The following changes have been made:  no change   Disposition:   FU with me in 6 months .    Signed, Mertie Moores, MD  09/27/2016 8:51 AM    [facility] [address], [place], [place]  [address] Phone: [phone]; Fax: [phone]

## 2016-09-27 NOTE — Patient Instructions (Signed)
Medication Instructions:  Your physician recommends that you continue on your current medications as directed. Please refer to the Current Medication list given to you today.   Labwork: Your physician recommends that you return for lab work (basic metabolic panel) in: 6 months at your next office visit with Dr. Acie Fredrickson   Testing/Procedures: None Ordered   Follow-Up: Your physician wants you to follow-up in: 6 months with Dr. Acie Fredrickson.  You will receive a reminder letter in the mail two months in advance. If you don't receive a letter, please call our office to schedule the follow-up appointment.   If you need a refill on your cardiac medications before your next appointment, please call your pharmacy.   Thank you for choosing [facility]! Christen Bame, RN [phone]

## 2017-03-14 ENCOUNTER — Other Ambulatory Visit: Payer: Self-pay | Admitting: Nurse Practitioner

## 2017-03-14 DIAGNOSIS — E782 Mixed hyperlipidemia: Secondary | ICD-10-CM

## 2017-03-30 ENCOUNTER — Other Ambulatory Visit: Payer: Self-pay | Admitting: Nurse Practitioner

## 2017-03-30 MED ORDER — FUROSEMIDE 20 MG PO TABS: 20.0000 mg | ORAL_TABLET | Freq: Every day | ORAL | 3 refills | Status: DC

## 2017-03-30 MED ORDER — LOSARTAN POTASSIUM 100 MG PO TABS: 100.0000 mg | ORAL_TABLET | Freq: Every day | ORAL | 3 refills | Status: DC

## 2017-03-30 MED ORDER — POTASSIUM CHLORIDE ER 10 MEQ PO TBCR
10.0000 meq | EXTENDED_RELEASE_TABLET | Freq: Every day | ORAL | 3 refills | Status: DC
Start: 2017-03-30 — End: 2018-01-02

## 2017-03-30 MED ORDER — CARVEDILOL 25 MG PO TABS: 25.0000 mg | ORAL_TABLET | Freq: Two times a day (BID) | ORAL | 3 refills | Status: DC

## 2017-04-26 ENCOUNTER — Encounter: Payer: Self-pay | Admitting: Cardiovascular Disease

## 2017-05-06 ENCOUNTER — Other Ambulatory Visit: Payer: Medicare Other | Admitting: *Deleted

## 2017-05-06 DIAGNOSIS — E782 Mixed hyperlipidemia: Secondary | ICD-10-CM | POA: Diagnosis not present

## 2017-05-06 LAB — COMPREHENSIVE METABOLIC PANEL
ALT: 28 IU/L (ref 0–44)
AST: 20 IU/L (ref 0–40)
Albumin/Globulin Ratio: 1.6 (ref 1.2–2.2)
Albumin: 4 g/dL (ref 3.6–4.8)
Alkaline Phosphatase: 65 IU/L (ref 39–117)
BUN/Creatinine Ratio: 16 (ref 10–24)
BUN: 15 mg/dL (ref 8–27)
Bilirubin Total: 0.6 mg/dL (ref 0.0–1.2)
CALCIUM: 9.7 mg/dL (ref 8.6–10.2)
CO2: 25 mmol/L (ref 18–29)
CREATININE: 0.94 mg/dL (ref 0.76–1.27)
Chloride: 101 mmol/L (ref 96–106)
GFR calc Af Amer: 97 mL/min/{1.73_m2} (ref >59)
GFR, EST NON AFRICAN AMERICAN: 84 mL/min/{1.73_m2} (ref >59)
Globulin, Total: 2.5 g/dL (ref 1.5–4.5)
Glucose: 100 mg/dL — ABNORMAL HIGH (ref 65–99)
POTASSIUM: 4.5 mmol/L (ref 3.5–5.2)
Sodium: 142 mmol/L (ref 134–144)
Total Protein: 6.5 g/dL (ref 6.0–8.5)

## 2017-05-06 LAB — LIPID PANEL
CHOL/HDL RATIO: 2 ratio (ref 0.0–5.0)
Cholesterol, Total: 137 mg/dL (ref 100–199)
HDL: 69 mg/dL (ref >39)
LDL CALC: 55 mg/dL (ref 0–99)
Triglycerides: 63 mg/dL (ref 0–149)
VLDL Cholesterol Cal: 13 mg/dL (ref 5–40)

## 2017-05-13 ENCOUNTER — Ambulatory Visit (INDEPENDENT_AMBULATORY_CARE_PROVIDER_SITE_OTHER): Payer: Medicare Other | Admitting: Cardiovascular Disease

## 2017-05-13 ENCOUNTER — Encounter: Payer: Self-pay | Admitting: Cardiovascular Disease

## 2017-05-13 VITALS — BP 110/70 | HR 81 | Ht 72.0 in | Wt 199.8 lb

## 2017-05-13 DIAGNOSIS — I5022 Chronic systolic (congestive) heart failure: Secondary | ICD-10-CM | POA: Diagnosis not present

## 2017-05-13 NOTE — Patient Instructions (Signed)
Medication Instructions:    Your physician recommends that you continue on your current medications as directed. Please refer to the Current Medication list given to you today.  - If you need a refill on your cardiac medications before your next appointment, please call your pharmacy.   Labwork:  Your physician recommends that you return for lab work in: 6 months for: CMET & Lipids.  You will need to be FASTING for this blood work.  Testing/Procedures:  None ordered  Follow-Up:  Your physician wants you to follow-up in: 6 months with Dr. Acie Fredrickson.  You will receive a reminder letter in the mail two months in advance. If you don't receive a letter, please call our office to schedule the follow-up appointment.  Thank you for choosing [facility]!!

## 2017-05-13 NOTE — Progress Notes (Signed)
Cardiology Office Note   Date:  05/13/2017   ID:  Mike [name], DOB 03-21-1951, MRN [id number]  PCP:  Patient, No Pcp Per  Cardiologist:   Mertie Moores, MD   Chief Complaint  Patient presents with  . Follow-up    Chronic systolic CHF    1. Congestive heart failure-original ejection fraction equals 25-30% - improved to 50%. . minor coronary artery irregularities by heart catheterization 2. Neurofibromatosis 3. Hx of GI bleed   History of Present Illness: Mike [name] is a 66 year old gentleman with a history of congestive heart failure. Cardiac catheterization performed 12/01/2010 reveals mild coronary artery irregularities. His ejection fraction is around 50%. He's not had any episodes of chest pain or shortness of breath.  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   The cost of his Diovan will be increasing after the first of the year. He would like to change to losartan.  July 04, 2013:  Mike [name] is doing well. He has not had any problems  01/16/2014:  Mike [name] is doing well. Still works Land at [employer]. He walks several miles a day in the course of his work.   August 13, 2014:  Mike [name] is doing well. No CP or dyspnea. No PND or orthopnea. No DOE Does stay fatigued. Does not sleep well at night. He does not have a medical doctor    Mike [name] is a 65 y.o. male who presents for follow up of his CHF  He remains quite active. He works as a Presenter, broadcasting at [employer]. He walks many miles throughout the course of his workday. He denies any chest pain or shortness breath.  08/21/2015:  Doing well.  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 No CP , no dyspnea.   February 25, 2016:  Mike [name] is doing  Well. 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  Is planning on retiring in June .   Wife  [name]) retired recently .  Oct. 9, 2017:  Mike [name] has retired  since I last saw him Not exercising as much   Breathing is good.   No CP   May 13, 2017  Mike [name] is seen today for follow up of his CHF Is enjoying retirement. Encouraged him to get some hobbies.  No CP or dyspnea.  Lipid profile looks great   Past Medical History:  Diagnosis Date  . CHF (congestive heart failure) (HCC)    Ejection Fraction 25-30%  . History of GI bleed   . Neurofibromatosis   . SOB (shortness of breath)     Past Surgical History:  Procedure Laterality Date  . ESOPHAGOGASTRODUODENOSCOPY  08/14/2007   With epinepherine and bipolar electrocoagulation therapy  . OTHER SURGICAL HISTORY     Splenectomy     Current Outpatient Prescriptions  Medication Sig Dispense Refill  . atorvastatin (LIPITOR) 20 MG tablet Take 1 tablet (20 mg total) by mouth daily. 90 tablet 3  . carvedilol (COREG) 25 MG tablet Take 1 tablet (25 mg total) by mouth 2 (two) times daily with a meal. 180 tablet 3  . ferrous gluconate (FERGON) 324 MG tablet Take 324 mg by mouth 2 (two) times daily with a meal.    . furosemide (LASIX) 20 MG tablet Take 1 tablet (20 mg total) by mouth daily. 90 tablet 3  . loratadine (CLARITIN) 10 MG  tablet Take 10 mg by mouth daily.    Marland Kitchen losartan (COZAAR) 100 MG tablet Take 1 tablet (100 mg total) by mouth daily. 90 tablet 3  . Multiple Vitamins-Minerals (CENTRUM SILVER PO) Take 1 tablet by mouth daily.     . potassium chloride (K-DUR) 10 MEQ tablet Take 1 tablet (10 mEq total) by mouth daily. 90 tablet 3   No current facility-administered medications for this visit.     Allergies:   Sulfonamide derivatives    Social History:  The patient  reports that he has never smoked. He has never used smokeless tobacco. He reports that he does not drink alcohol or use drugs.   Family History:  The patient's family history includes Cancer in his mother; Stroke in his father.    ROS:  Please see the history of present illness.    Review of Systems: Constitutional:   denies fever, chills, diaphoresis, appetite change and fatigue.  HEENT: denies photophobia, eye pain, redness, hearing loss, ear pain, congestion, sore throat, rhinorrhea, sneezing, neck pain, neck stiffness and tinnitus.  Respiratory: denies SOB, DOE, cough, chest tightness, and wheezing.  Cardiovascular: denies chest pain, palpitations and leg swelling.  Gastrointestinal: denies nausea, vomiting, abdominal pain, diarrhea, constipation, blood in stool.  Genitourinary: denies dysuria, urgency, frequency, hematuria, flank pain and difficulty urinating.  Musculoskeletal: denies  myalgias, back pain, joint swelling, arthralgias and gait problem.   Skin: denies pallor, rash and wound.  Neurological: denies dizziness, seizures, syncope, weakness, light-headedness, numbness and headaches.   Hematological: denies adenopathy, easy bruising, personal or family bleeding history.  Psychiatric/ Behavioral: denies suicidal ideation, mood changes, confusion, nervousness, sleep disturbance and agitation.       All other systems are reviewed and negative.    PHYSICAL EXAM: VS:  BP 110/70   Pulse 81   Ht 6' (1.829 m)   Wt 199 lb 12.8 oz (90.6 kg)   SpO2 96%   BMI 27.10 kg/m  , BMI Body mass index is 27.1 kg/m. GEN: Well nourished, well developed, in no acute distress  HEENT: normal  Neck: no JVD, carotid bruits, or masses Cardiac: RRR; no murmurs, rubs, or gallops,no edema  Respiratory:  clear to auscultation bilaterally, normal work of breathing GI: soft, nontender, nondistended, + BS MS: no deformity or atrophy  Skin: warm and dry, multiple neurofibromas  Neuro:  Strength and sensation are intact Psych: normal   EKG:  EKG is ordered today. The ekg ordered today demonstrates NSR at 73.  1st degree AV block.  Occasional PVC.  poor R wave progression  Tries to avoid excessive salt   Recent Labs: 05/06/2017: ALT 28; BUN 15; Creatinine, Ser 0.94; Potassium 4.5; Sodium 142    Lipid Panel      Component Value Date/Time   CHOL 137 05/06/2017 0811   TRIG 63 05/06/2017 0811   HDL 69 05/06/2017 0811   CHOLHDL 2.0 05/06/2017 0811   CHOLHDL 2.1 02/25/2016 0932   VLDL 12 02/25/2016 0932   LDLCALC 55 05/06/2017 0811      Wt Readings from Last 3 Encounters:  05/13/17 199 lb 12.8 oz (90.6 kg)  09/27/16 191 lb 12.8 oz (87 kg)  02/25/16 202 lb (91.6 kg)      Other studies Reviewed: Additional studies/ records that were reviewed today include: . Review of the above records demonstrates:    ASSESSMENT AND PLAN:  1. Congestive heart failure-original ejection fraction equals 25-30% - improved to 50%.  minor coronary artery irregularities by heart catheterization.  His blood pressure and heart rate are normal. Continue same medications.  2. Neurofibromatosis -   3. Hx of GI bleed - no signs of bleeding   4. Hyperlipidemia: will check labs at next visit   Current medicines are reviewed at length with the patient today.  The patient does not have concerns regarding medicines.  The following changes have been made:  no change   Disposition:   FU with me in 6 months .    Signed, Mertie Moores, MD  05/13/2017 3:32 PM    [facility] [address], [place], [place]  [address] Phone: [phone]; Fax: [phone]

## 2017-09-15 ENCOUNTER — Telehealth: Payer: Self-pay | Admitting: Cardiovascular Disease

## 2017-09-15 NOTE — Telephone Encounter (Signed)
New Message   *STAT* If patient is at the pharmacy, call can be transferred to refill team.   1. Which medications need to be refilled? (please list name of each medication and dose if known) Lipitor 20mg    2. Which pharmacy/location (including street and city if local pharmacy) is medication to be sent to? Pt would like a written script for medication  3. Do they need a 30 day or 90 day supply? 90 day  Pt wants to take script to [facility] and get it filled. Please call back to discuss

## 2017-09-16 MED ORDER — ATORVASTATIN CALCIUM 20 MG PO TABS: 20.0000 mg | ORAL_TABLET | Freq: Every day | ORAL | 3 refills | Status: DC

## 2017-09-16 NOTE — Telephone Encounter (Signed)
Received call from operator who states patient called back to say that he needs only the Atorvastatin Rx and that he will pick it up from our office later today.

## 2017-09-16 NOTE — Telephone Encounter (Signed)
Left detailed message on patient's personal voice mail to call back to let me know whether to place the Rx in the mail or if he will come by the office to pick up

## 2018-01-02 ENCOUNTER — Encounter: Payer: Self-pay | Admitting: Cardiovascular Disease

## 2018-01-02 ENCOUNTER — Ambulatory Visit (INDEPENDENT_AMBULATORY_CARE_PROVIDER_SITE_OTHER): Payer: Medicare Other | Admitting: Cardiovascular Disease

## 2018-01-02 VITALS — BP 108/72 | HR 82 | Ht 72.0 in | Wt 199.8 lb

## 2018-01-02 DIAGNOSIS — E782 Mixed hyperlipidemia: Secondary | ICD-10-CM | POA: Diagnosis not present

## 2018-01-02 DIAGNOSIS — I5022 Chronic systolic (congestive) heart failure: Secondary | ICD-10-CM | POA: Diagnosis not present

## 2018-01-02 LAB — LIPID PANEL
Chol/HDL Ratio: 2 ratio (ref 0.0–5.0)
Cholesterol, Total: 143 mg/dL (ref 100–199)
HDL: 73 mg/dL (ref >39)
LDL Calculated: 54 mg/dL (ref 0–99)
Triglycerides: 78 mg/dL (ref 0–149)
VLDL Cholesterol Cal: 16 mg/dL (ref 5–40)

## 2018-01-02 LAB — BASIC METABOLIC PANEL
BUN/Creatinine Ratio: 21 (ref 10–24)
BUN: 21 mg/dL (ref 8–27)
CO2: 23 mmol/L (ref 20–29)
CREATININE: 1.02 mg/dL (ref 0.76–1.27)
Calcium: 9.6 mg/dL (ref 8.6–10.2)
Chloride: 103 mmol/L (ref 96–106)
GFR calc Af Amer: 87 mL/min/{1.73_m2} (ref >59)
GFR calc non Af Amer: 75 mL/min/{1.73_m2} (ref >59)
GLUCOSE: 107 mg/dL — AB (ref 65–99)
Potassium: 4.7 mmol/L (ref 3.5–5.2)
Sodium: 139 mmol/L (ref 134–144)

## 2018-01-02 LAB — HEPATIC FUNCTION PANEL
ALBUMIN: 4.1 g/dL (ref 3.6–4.8)
ALT: 25 IU/L (ref 0–44)
AST: 18 IU/L (ref 0–40)
Alkaline Phosphatase: 83 IU/L (ref 39–117)
BILIRUBIN, DIRECT: 0.16 mg/dL (ref 0.00–0.40)
Bilirubin Total: 0.5 mg/dL (ref 0.0–1.2)
TOTAL PROTEIN: 7 g/dL (ref 6.0–8.5)

## 2018-01-02 NOTE — Patient Instructions (Signed)
Medication Instructions:  Your physician recommends that you continue on your current medications as directed. Please refer to the Current Medication list given to you today.   Labwork: TODAY  - cholesterol, liver panel, basic metabolic panel   Testing/Procedures: Your physician has requested that you have an echocardiogram. Echocardiography is a painless test that uses sound waves to create images of your heart. It provides your doctor with information about the size and shape of your heart and how well your heart's chambers and valves are working. This procedure takes approximately one hour. There are no restrictions for this procedure.   Follow-Up: Your physician wants you to follow-up in: 6 months with Dr. Acie Fredrickson.  You will receive a reminder letter in the mail two months in advance. If you don't receive a letter, please call our office to schedule the follow-up appointment.   If you need a refill on your cardiac medications before your next appointment, please call your pharmacy.   Thank you for choosing [facility]! Christen Bame, RN [phone]

## 2018-01-02 NOTE — Progress Notes (Signed)
Cardiology Office Note   Date:  01/02/2018   ID:  Mike [name], DOB 27-May-1951, MRN [id number]  PCP:  Patient, No Pcp Per  Cardiologist:   Mertie Moores, MD   Chief Complaint  Patient presents with  . Congestive Heart Failure   1. Congestive heart failure-original ejection fraction equals 25-30% - improved to 50%. . minor coronary artery irregularities by heart catheterization 2. Neurofibromatosis 3. Hx of GI bleed   History of Present Illness: Mike [name] is a 67 year old gentleman with a history of congestive heart failure. Cardiac catheterization performed 12/01/2010 reveals mild coronary artery irregularities. His ejection fraction is around 50%. He's not had any episodes of chest pain or shortness of breath.  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   The cost of his Diovan will be increasing after the first of the year. He would like to change to losartan.  July 04, 2013:  Mike [name] is doing well. He has not had any problems  01/16/2014:  Mike [name] is doing well. 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   August 13, 2014:  Mike [name] is doing well. No CP or dyspnea. No PND or orthopnea. No DOE Does stay fatigued. Does not sleep well at night. He does not have a medical doctor    [name] is a 68 y.o. male who presents for follow up of his CHF  He remains quite active. He works as a Presenter, broadcasting at [employer]. He walks many miles throughout the course of his workday. He denies any chest pain or shortness breath.  08/21/2015:  Doing well.  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 No CP , no dyspnea.   February 25, 2016:  Mike [name] is doing  Well. 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  Is planning on retiring in June .   Wife  [name]) retired recently .  Oct. 9, 2017:  Mike [name] has retired since I  last saw him Not exercising as much   Breathing is good.   No CP   May 13, 2017  Mike [name] is seen today for follow up of his CHF Is enjoying retirement. Encouraged him to get some hobbies.  No CP or dyspnea.  Lipid profile looks great   Jan. 14, 2019:  Doing  Well.   Some exercise , doesn't do much since retiring from [employer].  No CP or dyspnea.    Past Medical History:  Diagnosis Date  . CHF (congestive heart failure) (HCC)    Ejection Fraction 25-30%  . History of GI bleed   . Neurofibromatosis   . SOB (shortness of breath)     Past Surgical History:  Procedure Laterality Date  . ESOPHAGOGASTRODUODENOSCOPY  08/14/2007   With epinepherine and bipolar electrocoagulation therapy  . OTHER SURGICAL HISTORY     Splenectomy     Current Outpatient Medications  Medication Sig Dispense Refill  . atorvastatin (LIPITOR) 20 MG tablet Take 1 tablet (20 mg total) by mouth daily. 90 tablet 3  . carvedilol (COREG) 25 MG tablet Take 1 tablet (25 mg total) by mouth 2 (two) times daily with a meal. 180 tablet 3  . ferrous gluconate (FERGON) 324 MG tablet Take 324 mg by mouth 2 (two) times daily with a meal.    .  furosemide (LASIX) 20 MG tablet Take 1 tablet (20 mg total) by mouth daily. 90 tablet 3  . loratadine (CLARITIN) 10 MG tablet Take 10 mg by mouth daily.    Marland Kitchen losartan (COZAAR) 100 MG tablet Take 1 tablet (100 mg total) by mouth daily. 90 tablet 3  . Multiple Vitamins-Minerals (CENTRUM SILVER PO) Take 1 tablet by mouth daily.      No current facility-administered medications for this visit.     Allergies:   Sulfonamide derivatives    Social History:  The patient  reports that  has never smoked. he has never used smokeless tobacco. He reports that he does not drink alcohol or use drugs.   Family History:  The patient's family history includes Cancer in his mother; Stroke in his father.    ROS: Noted in current history.  Otherwise systems are negative.  Physical  Exam: Blood pressure 108/72, pulse 82, height 6' (1.829 m), weight 199 lb 12.8 oz (90.6 kg).  GEN:  Well nourished, well developed in no acute distress,  Extensive neurofibromatosis present HEENT: Normal NECK: No JVD; No carotid bruits LYMPHATICS: No lymphadenopathy CARDIAC: RR   Normal S1S2  RESPIRATORY:  Clear to auscultation without rales, wheezing or rhonchi  ABDOMEN: Soft, non-tender, non-distended MUSCULOSKELETAL:  No edema; No deformity  SKIN:  Extensive Neurofibromatosis  NEUROLOGIC:  Alert and oriented x 3   EKG:   January 02, 2017: Normal sinus rhythm at 82.  First-degree AV block.  Incomplete left bundle branch block  Recent Labs: 05/06/2017: ALT 28; BUN 15; Creatinine, Ser 0.94; Potassium 4.5; Sodium 142    Lipid Panel    Component Value Date/Time   CHOL 137 05/06/2017 0811   TRIG 63 05/06/2017 0811   HDL 69 05/06/2017 0811   CHOLHDL 2.0 05/06/2017 0811   CHOLHDL 2.1 02/25/2016 0932   VLDL 12 02/25/2016 0932   LDLCALC 55 05/06/2017 0811      Wt Readings from Last 3 Encounters:  01/02/18 199 lb 12.8 oz (90.6 kg)  05/13/17 199 lb 12.8 oz (90.6 kg)  09/27/16 191 lb 12.8 oz (87 kg)      Other studies Reviewed: Additional studies/ records that were reviewed today include: . Review of the above records demonstrates:    ASSESSMENT AND PLAN:  1. Congestive heart failure-Tom seems to be doing fairly well.  Continue current medications  2. Neurofibromatosis -progressive  3. Hx of GI bleed -no evidence of bleeding  4. Hyperlipidemia:  Stable   Current medicines are reviewed at length with the patient today.  The patient does not have concerns regarding medicines.  The following changes have been made:  no change   Disposition:   FU with me in 6 months .    Signed, Mertie Moores, MD  01/02/2018 10:18 AM    [address] [place], [place],   [address] Phone: [phone]; Fax: [phone]

## 2018-01-03 ENCOUNTER — Other Ambulatory Visit: Payer: Self-pay | Admitting: Nurse Practitioner

## 2018-01-03 MED ORDER — ATORVASTATIN CALCIUM 20 MG PO TABS: 20.0000 mg | ORAL_TABLET | Freq: Every day | ORAL | 3 refills | Status: DC

## 2018-01-03 MED ORDER — LOSARTAN POTASSIUM 100 MG PO TABS: 100.0000 mg | ORAL_TABLET | Freq: Every day | ORAL | 3 refills | Status: DC

## 2018-01-03 MED ORDER — FUROSEMIDE 20 MG PO TABS: 20.0000 mg | ORAL_TABLET | Freq: Every day | ORAL | 3 refills | Status: DC

## 2018-01-03 MED ORDER — CARVEDILOL 25 MG PO TABS: 25.0000 mg | ORAL_TABLET | Freq: Two times a day (BID) | ORAL | 3 refills | Status: DC

## 2018-01-03 MED ORDER — POTASSIUM CHLORIDE ER 10 MEQ PO TBCR: 10.0000 meq | EXTENDED_RELEASE_TABLET | Freq: Every day | ORAL | 3 refills | Status: DC

## 2018-01-11 ENCOUNTER — Ambulatory Visit (HOSPITAL_COMMUNITY): Payer: Medicare Other | Attending: Cardiovascular Disease

## 2018-01-11 ENCOUNTER — Other Ambulatory Visit: Payer: Self-pay

## 2018-01-11 DIAGNOSIS — E782 Mixed hyperlipidemia: Secondary | ICD-10-CM | POA: Diagnosis not present

## 2018-01-11 DIAGNOSIS — I5022 Chronic systolic (congestive) heart failure: Secondary | ICD-10-CM

## 2018-01-12 ENCOUNTER — Telehealth: Payer: Self-pay | Admitting: Cardiovascular Disease

## 2018-01-12 NOTE — Telephone Encounter (Signed)
Left detailed message on patient's voice mail of stable echo results. I advised him to call back to discuss the details.

## 2018-01-12 NOTE — Telephone Encounter (Signed)
Patient returning call for echo results. 

## 2018-04-24 ENCOUNTER — Telehealth: Payer: Self-pay | Admitting: Cardiovascular Disease

## 2018-04-24 ENCOUNTER — Other Ambulatory Visit: Payer: Self-pay | Admitting: *Deleted

## 2018-04-24 MED ORDER — POTASSIUM CHLORIDE ER 10 MEQ PO TBCR: 10.0000 meq | EXTENDED_RELEASE_TABLET | Freq: Every day | ORAL | 3 refills | Status: DC

## 2018-04-24 NOTE — Addendum Note (Signed)
Addended by: Emmaline Life on: 04/24/2018 04:06 PM   Modules accepted: Orders

## 2018-04-24 NOTE — Telephone Encounter (Signed)
Pt calling requesting a printed prescription for potassium, so pt can pick up at the front desk. Pt would like a call back concerning this matter. Please address

## 2018-04-24 NOTE — Telephone Encounter (Signed)
Spoke with patient who states he only needs the potassium chloride Rx currently, states he has refills on the other medications. I advised that it will likely be later tomorrow before I can get his Rx signed. He states he will come by to pick up after tomorrow and thanked me for the call.

## 2018-07-04 DIAGNOSIS — E782 Mixed hyperlipidemia: Secondary | ICD-10-CM | POA: Diagnosis not present

## 2018-07-04 DIAGNOSIS — Z0000 Encounter for general adult medical examination without abnormal findings: Secondary | ICD-10-CM | POA: Diagnosis not present

## 2018-07-04 DIAGNOSIS — I5022 Chronic systolic (congestive) heart failure: Secondary | ICD-10-CM | POA: Diagnosis not present

## 2018-07-04 DIAGNOSIS — Q8500 Neurofibromatosis, unspecified: Secondary | ICD-10-CM | POA: Diagnosis not present

## 2018-07-04 DIAGNOSIS — Z1159 Encounter for screening for other viral diseases: Secondary | ICD-10-CM | POA: Diagnosis not present

## 2018-07-04 DIAGNOSIS — Z125 Encounter for screening for malignant neoplasm of prostate: Secondary | ICD-10-CM | POA: Diagnosis not present

## 2018-08-11 DIAGNOSIS — Q8500 Neurofibromatosis, unspecified: Secondary | ICD-10-CM | POA: Diagnosis not present

## 2018-08-11 DIAGNOSIS — R972 Elevated prostate specific antigen [PSA]: Secondary | ICD-10-CM | POA: Diagnosis not present

## 2018-08-23 ENCOUNTER — Ambulatory Visit (INDEPENDENT_AMBULATORY_CARE_PROVIDER_SITE_OTHER): Payer: Medicare Other | Admitting: Cardiovascular Disease

## 2018-08-23 ENCOUNTER — Encounter (INDEPENDENT_AMBULATORY_CARE_PROVIDER_SITE_OTHER): Payer: Self-pay

## 2018-08-23 ENCOUNTER — Encounter: Payer: Self-pay | Admitting: Cardiovascular Disease

## 2018-08-23 VITALS — BP 100/60 | HR 77 | Ht 72.0 in | Wt 203.0 lb

## 2018-08-23 DIAGNOSIS — I5022 Chronic systolic (congestive) heart failure: Secondary | ICD-10-CM | POA: Diagnosis not present

## 2018-08-23 NOTE — Progress Notes (Signed)
Cardiology Office Note   Date:  08/23/2018   ID:  Mike [name], DOB August 23, 1951, MRN [id number]  PCP:  Osie Cheeks, PA-C  Cardiologist:   Mertie Moores, MD   Chief Complaint  Patient presents with  . Congestive Heart Failure   1. Congestive heart failure-original ejection fraction equals 25-30% - improved to 50%. . minor coronary artery irregularities by heart catheterization 2. Neurofibromatosis 3. Hx of GI bleed   History of Present Illness: Mike [name] is a 67 year old gentleman with a history of congestive heart failure. Cardiac catheterization performed 12/01/2010 reveals mild coronary artery irregularities. His ejection fraction is around 50%. He's not had any episodes of chest pain or shortness of breath.  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   The cost of his Diovan will be increasing after the first of the year. He would like to change to losartan.  July 04, 2013:  Mike [name] is doing well. He has not had any problems  01/16/2014:  Mike [name] is doing well. 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   August 13, 2014:  Mike [name] is doing well. No CP or dyspnea. No PND or orthopnea. No DOE Does stay fatigued. Does not sleep well at night. He does not have a medical doctor    Mike [name] is a 67 y.o. male who presents for follow up of his CHF  He remains quite active. He works as a Presenter, broadcasting at [employer]. He walks many miles throughout the course of his workday. He denies any chest pain or shortness breath.  08/21/2015:  Doing well.  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 No CP , no dyspnea.   February 25, 2016:  Mike [name] is doing  Well. 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  Is planning on retiring in June .   Wife  [name]) retired recently .  Oct. 9, 2017:  Mike [name] has retired since  I last saw him Not exercising as much   Breathing is good.   No CP   May 13, 2017  Mike [name] is seen today for follow up of his CHF Is enjoying retirement. Encouraged him to get some hobbies.  No CP or dyspnea.  Lipid profile looks great   Jan. 14, 2019:  Doing  Well.   Some exercise , doesn't do much since retiring from [employer].  No CP or dyspnea.    Sept. 4, 2019:  Mike [name] is seen today for follow of his congestive heart failure: No problems , Now goes to [facility] - Lupita Leash, MD  Was found to have an elevated PSA.   Has seen urology .   No plans for  Not walking as much as he should   Past Medical History:  Diagnosis Date  . CHF (congestive heart failure) (HCC)    Ejection Fraction 25-30%  . History of GI bleed   . Neurofibromatosis   . SOB (shortness of breath)     Past Surgical History:  Procedure Laterality Date  . ESOPHAGOGASTRODUODENOSCOPY  08/14/2007   With epinepherine and bipolar electrocoagulation therapy  . OTHER SURGICAL HISTORY     Splenectomy     Current Outpatient Medications  Medication Sig Dispense Refill  . atorvastatin (LIPITOR) 20 MG tablet Take 1  tablet (20 mg total) by mouth daily. 90 tablet 3  . carvedilol (COREG) 25 MG tablet Take 1 tablet (25 mg total) by mouth 2 (two) times daily with a meal. 180 tablet 3  . ferrous gluconate (FERGON) 324 MG tablet Take 324 mg by mouth 2 (two) times daily with a meal.    . furosemide (LASIX) 20 MG tablet Take 1 tablet (20 mg total) by mouth daily. 90 tablet 3  . loratadine (CLARITIN) 10 MG tablet Take 10 mg by mouth daily.    Marland Kitchen losartan (COZAAR) 100 MG tablet Take 1 tablet (100 mg total) by mouth daily. 90 tablet 3  . Multiple Vitamins-Minerals (CENTRUM SILVER PO) Take 1 tablet by mouth daily.     . potassium chloride (K-DUR) 10 MEQ tablet Take 1 tablet (10 mEq total) by mouth daily. 90 tablet 3   No current facility-administered medications for this visit.     Allergies:    Sulfonamide derivatives    Social History:  The patient  reports that he has never smoked. He has never used smokeless tobacco. He reports that he does not drink alcohol or use drugs.   Family History:  The patient's family history includes Cancer in his mother; Stroke in his father.    ROS: Noted in current history.  Otherwise systems are negative.   Physical Exam: Blood pressure 100/60, pulse 77, height 6' (1.829 m), weight 203 lb (92.1 kg), SpO2 95 %.  GEN:  Well nourished, well developed in no acute distress HEENT: Normal NECK: No JVD; No carotid bruits LYMPHATICS: No lymphadenopathy CARDIAC: RRR , no murmurs, rubs, gallops RESPIRATORY:  Clear to auscultation without rales, wheezing or rhonchi  ABDOMEN: Soft, non-tender, non-distended MUSCULOSKELETAL:  No edema; No deformity  SKIN: + neuro NEUROLOGIC:  Alert and oriented x 3   EKG:     Recent Labs: 01/02/2018: ALT 25; BUN 21; Creatinine, Ser 1.02; Potassium 4.7; Sodium 139    Lipid Panel    Component Value Date/Time   CHOL 143 01/02/2018 1049   TRIG 78 01/02/2018 1049   HDL 73 01/02/2018 1049   CHOLHDL 2.0 01/02/2018 1049   CHOLHDL 2.1 02/25/2016 0932   VLDL 12 02/25/2016 0932   LDLCALC 54 01/02/2018 1049      Wt Readings from Last 3 Encounters:  08/23/18 203 lb (92.1 kg)  01/02/18 199 lb 12.8 oz (90.6 kg)  05/13/17 199 lb 12.8 oz (90.6 kg)      Other studies Reviewed: Additional studies/ records that were reviewed today include: . Review of the above records demonstrates:    ASSESSMENT AND PLAN:  1. Congestive heart failure-mitral lites look good.  Creatinine is 0.96.  2. Neurofibromatosis -progressive  3. Hx of GI bleed -   4. Hyperlipidemia:    He had labs drawn at his primary medical doctors.  These are available in care everywhere.  His last LDL is 73.  Total cholesterol is 147.  HDL is 69.  Triglyceride level is 70.   Current medicines are reviewed at length with the patient today.  The  patient does not have concerns regarding medicines.  The following changes have been made:  no change   Disposition:   FU with me in 6 months .    Signed, Mertie Moores, MD  08/23/2018 9:24 AM    [facility] [place], [place], [place]  [address] Phone: [phone]; Fax: [phone]

## 2018-08-23 NOTE — Patient Instructions (Signed)
Medication Instructions:  Your physician recommends that you continue on your current medications as directed. Please refer to the Current Medication list given to you today.   Labwork: None Ordered   Testing/Procedures: None Ordered   Follow-Up: Your physician wants you to follow-up in: 1 year with Dr. Nahser.  You will receive a reminder letter in the mail two months in advance. If you don't receive a letter, please call our office to schedule the follow-up appointment.   If you need a refill on your cardiac medications before your next appointment, please call your pharmacy.   Thank you for choosing [facility]! Ronnette Rump, RN [phone]    

## 2018-09-19 DIAGNOSIS — Z23 Encounter for immunization: Secondary | ICD-10-CM | POA: Diagnosis not present

## 2018-11-13 DIAGNOSIS — R972 Elevated prostate specific antigen [PSA]: Secondary | ICD-10-CM | POA: Diagnosis not present

## 2018-11-14 MED FILL — CIPROFLOXACIN HCL 500 MG TA: 500 | 10 days supply | Qty: 20 | Fill #0

## 2018-12-02 DIAGNOSIS — I5022 Chronic systolic (congestive) heart failure: Secondary | ICD-10-CM | POA: Diagnosis not present

## 2018-12-02 DIAGNOSIS — I110 Hypertensive heart disease with heart failure: Secondary | ICD-10-CM | POA: Diagnosis present

## 2018-12-02 DIAGNOSIS — D62 Acute posthemorrhagic anemia: Secondary | ICD-10-CM | POA: Diagnosis present

## 2018-12-02 DIAGNOSIS — K3189 Other diseases of stomach and duodenum: Secondary | ICD-10-CM | POA: Diagnosis not present

## 2018-12-02 DIAGNOSIS — E782 Mixed hyperlipidemia: Secondary | ICD-10-CM | POA: Diagnosis not present

## 2018-12-02 DIAGNOSIS — I2510 Atherosclerotic heart disease of native coronary artery without angina pectoris: Secondary | ICD-10-CM | POA: Diagnosis present

## 2018-12-02 DIAGNOSIS — E785 Hyperlipidemia, unspecified: Secondary | ICD-10-CM | POA: Diagnosis present

## 2018-12-02 DIAGNOSIS — Z9081 Acquired absence of spleen: Secondary | ICD-10-CM | POA: Diagnosis not present

## 2018-12-02 DIAGNOSIS — Z8249 Family history of ischemic heart disease and other diseases of the circulatory system: Secondary | ICD-10-CM | POA: Diagnosis not present

## 2018-12-02 DIAGNOSIS — D509 Iron deficiency anemia, unspecified: Secondary | ICD-10-CM | POA: Diagnosis not present

## 2018-12-02 DIAGNOSIS — Q8500 Neurofibromatosis, unspecified: Secondary | ICD-10-CM | POA: Diagnosis not present

## 2018-12-02 DIAGNOSIS — I4891 Unspecified atrial fibrillation: Secondary | ICD-10-CM | POA: Diagnosis not present

## 2018-12-02 DIAGNOSIS — R42 Dizziness and giddiness: Secondary | ICD-10-CM | POA: Diagnosis not present

## 2018-12-02 DIAGNOSIS — R972 Elevated prostate specific antigen [PSA]: Secondary | ICD-10-CM | POA: Diagnosis not present

## 2018-12-02 DIAGNOSIS — R5383 Other fatigue: Secondary | ICD-10-CM | POA: Diagnosis not present

## 2018-12-02 DIAGNOSIS — K269 Duodenal ulcer, unspecified as acute or chronic, without hemorrhage or perforation: Secondary | ICD-10-CM | POA: Diagnosis not present

## 2018-12-02 DIAGNOSIS — I10 Essential (primary) hypertension: Secondary | ICD-10-CM | POA: Diagnosis not present

## 2018-12-02 DIAGNOSIS — Z803 Family history of malignant neoplasm of breast: Secondary | ICD-10-CM | POA: Diagnosis not present

## 2018-12-02 DIAGNOSIS — K922 Gastrointestinal hemorrhage, unspecified: Secondary | ICD-10-CM | POA: Diagnosis not present

## 2018-12-02 DIAGNOSIS — Z8719 Personal history of other diseases of the digestive system: Secondary | ICD-10-CM | POA: Diagnosis not present

## 2018-12-02 DIAGNOSIS — Z9889 Other specified postprocedural states: Secondary | ICD-10-CM | POA: Diagnosis not present

## 2018-12-02 DIAGNOSIS — I480 Paroxysmal atrial fibrillation: Secondary | ICD-10-CM | POA: Diagnosis present

## 2018-12-02 DIAGNOSIS — K264 Chronic or unspecified duodenal ulcer with hemorrhage: Secondary | ICD-10-CM | POA: Diagnosis present

## 2018-12-02 DIAGNOSIS — K259 Gastric ulcer, unspecified as acute or chronic, without hemorrhage or perforation: Secondary | ICD-10-CM | POA: Diagnosis present

## 2018-12-05 MED FILL — PANTOPRAZOLE SOD DR 40 MG T: 40 | 30 days supply | Qty: 30 | Fill #0

## 2018-12-06 DIAGNOSIS — K922 Gastrointestinal hemorrhage, unspecified: Secondary | ICD-10-CM | POA: Diagnosis not present

## 2018-12-11 DIAGNOSIS — I480 Paroxysmal atrial fibrillation: Secondary | ICD-10-CM | POA: Diagnosis not present

## 2018-12-11 DIAGNOSIS — I5022 Chronic systolic (congestive) heart failure: Secondary | ICD-10-CM | POA: Diagnosis not present

## 2018-12-11 DIAGNOSIS — K922 Gastrointestinal hemorrhage, unspecified: Secondary | ICD-10-CM | POA: Diagnosis not present

## 2018-12-11 DIAGNOSIS — Q8500 Neurofibromatosis, unspecified: Secondary | ICD-10-CM | POA: Diagnosis not present

## 2018-12-18 DIAGNOSIS — R972 Elevated prostate specific antigen [PSA]: Secondary | ICD-10-CM | POA: Diagnosis not present

## 2019-01-03 ENCOUNTER — Encounter: Payer: Self-pay | Admitting: Physician Assistant

## 2019-01-03 ENCOUNTER — Ambulatory Visit (INDEPENDENT_AMBULATORY_CARE_PROVIDER_SITE_OTHER): Payer: Medicare Other | Admitting: Physician Assistant

## 2019-01-03 VITALS — BP 138/70 | HR 73 | Ht 72.0 in | Wt 196.0 lb

## 2019-01-03 DIAGNOSIS — I5022 Chronic systolic (congestive) heart failure: Secondary | ICD-10-CM

## 2019-01-03 DIAGNOSIS — E782 Mixed hyperlipidemia: Secondary | ICD-10-CM | POA: Diagnosis not present

## 2019-01-03 DIAGNOSIS — Z8719 Personal history of other diseases of the digestive system: Secondary | ICD-10-CM | POA: Diagnosis not present

## 2019-01-03 DIAGNOSIS — I480 Paroxysmal atrial fibrillation: Secondary | ICD-10-CM | POA: Diagnosis not present

## 2019-01-03 NOTE — Progress Notes (Signed)
Cardiology Office Note:    Date:  01/03/2019   ID:  Mike [name], DOB 03-Oct-1951, MRN [id number]  PCP:  Osie Cheeks, PA-C  Cardiologist:  Mertie Moores, MD   Electrophysiologist:  None   Referring MD: Osie Cheeks, PA*   Chief Complaint  Patient presents with  . Hospitalization Follow-up    amdx with GI bleed; c/b PAF with RVR     History of Present Illness:    Mike [name] is a 68 y.o. male with systolic heart failure secondary to nonischemic cardiomyopathy, neurofibromatosis, GI bleeding.  He was last seen by Dr. Acie [name] in September 2019.  He was admitted to [facility] 12/02/2018-12/04/2018 with recurrent GI bleed.  EGD demonstrated a bleeding duodenal lesion which was injected with epinephrine.  During his hospital stay, he did develop atrial fibrillation with rapid rate.  He converted to sinus rhythm on IV rate controlling medication.  Anticoagulation was not started due to his presentation with GI bleeding.  It was recommend he follow-up with cardiology and consider long term cardiac monitor.   Mr. Anger returns for follow up.  He is here alone.  He did not have any symptoms when he was in atrial fibrillation.  He denies palpitations, chest pain, shortness of breath, syncope, paroxysmal nocturnal dyspnea, edema.  He has not had any recurrent bleeding.    Prior CV studies:   The following studies were reviewed today:  Echo 01/11/18 EF 50-55, normal wall motion, grade 1 diastolic dysfunction  Echo 07/28/2012 Moderate LVH, EF 45-50, diffuse hypokinesis, grade 2 diastolic dysfunction, mild AI, mild LAE, PASP 31  Cardiac catheterization 11/30/2010 EF 40 LM normal LAD mid 30 LCx normal RCA normal  Past Medical History:  Diagnosis Date  . CHF (congestive heart failure) (HCC)    Ejection Fraction 25-30%  . History of GI bleed   . Neurofibromatosis   . SOB (shortness of breath)    Surgical Hx: The patient  has a past surgical history  that includes Other surgical history and Esophagogastroduodenoscopy (08/14/2007).   Current Medications: Current Meds  Medication Sig  . atorvastatin (LIPITOR) 20 MG tablet Take 1 tablet (20 mg total) by mouth daily.  . carvedilol (COREG) 25 MG tablet Take 1 tablet (25 mg total) by mouth 2 (two) times daily with a meal.  . ferrous gluconate (FERGON) 324 MG tablet Take 324 mg by mouth 2 (two) times daily with a meal.  . furosemide (LASIX) 20 MG tablet Take 1 tablet (20 mg total) by mouth daily.  Marland Kitchen loratadine (CLARITIN) 10 MG tablet Take 10 mg by mouth daily.  Marland Kitchen losartan (COZAAR) 100 MG tablet Take 1 tablet (100 mg total) by mouth daily.  . Multiple Vitamins-Minerals (CENTRUM SILVER PO) Take 1 tablet by mouth daily.   . Omega-3 1000 MG CAPS Take 57 mg by mouth 2 (two) times daily.   . pantoprazole (PROTONIX) 40 MG tablet Take 40 mg by mouth daily.   . potassium chloride (K-DUR) 10 MEQ tablet Take 1 tablet (10 mEq total) by mouth daily.     Allergies:   Sulfonamide derivatives   Social History   Tobacco Use  . Smoking status: Never Smoker  . Smokeless tobacco: Never Used  Substance Use Topics  . Alcohol use: No  . Drug use: No     Family Hx: The patient's family history includes Cancer in his mother; Stroke in his father.  ROS:   Please see the history of present illness.  Review of Systems  Gastrointestinal: Positive for hematochezia.   All other systems reviewed and are negative.   EKGs/Labs/Other Test Reviewed:    EKG:  EKG is   ordered today.  The ekg ordered today demonstrates NSR, HR 73, low voltage, ant-sept Q waves, QTc 464, no changes.   Recent Labs: No results found for requested labs within last 8760 hours.   Recent Lipid Panel Lab Results  Component Value Date/Time   CHOL 143 01/02/2018 10:49 AM   TRIG 78 01/02/2018 10:49 AM   HDL 73 01/02/2018 10:49 AM   CHOLHDL 2.0 01/02/2018 10:49 AM   CHOLHDL 2.1 02/25/2016 09:32 AM   LDLCALC 54 01/02/2018 10:49 AM     [place]:   Physical Exam:    VS:  BP 138/70   Pulse 73   Ht 6' (1.829 m)   Wt 196 lb (88.9 kg)   SpO2 98%   BMI 26.58 kg/m     Wt Readings from Last 3 Encounters:  01/03/19 196 lb (88.9 kg)  08/23/18 203 lb (92.1 kg)  01/02/18 199 lb 12.8 oz (90.6 kg)     Physical Exam  Constitutional: He is oriented to person, place, and time. He appears well-developed and well-nourished. No distress.  HENT:  Head: Normocephalic and atraumatic.  Eyes: No scleral icterus.  Neck: Neck supple. No JVD present. No thyromegaly present.  Cardiovascular: Normal rate, regular rhythm, S1 normal and S2 normal.  No murmur heard. Pulmonary/Chest: Breath sounds normal. He has no rales.  Abdominal: Soft. There is no hepatomegaly.  Musculoskeletal:        General: No edema.  Lymphadenopathy:    He has no cervical adenopathy.  Neurological: He is alert and oriented to person, place, and time.  Skin: Skin is warm and dry.  Psychiatric: He has a normal mood and affect.    ASSESSMENT & PLAN:    PAF (paroxysmal atrial fibrillation) ([place]) Episode of AFib with RVR documented during admission to [facility] with GI bleed. He spontaneously converted to NSR.  CHADS2-VASc=3 (CHF, HTN, age x 1).  He would benefit from long term anticoagulation for stroke prevention.  However, with his neurofibromatosis and hx of recurrent GI bleeding, he is not an ideal candidate for long term anticoagulation.  Question if his atrial fibrillation may have been related to his acute illness.  TSH in 7/19 was normal.  I have recommended we obtain an event monitor to screen for recurrent AFib and assess AFib burden.  If he is having a lot of atrial fibrillation, we will need to determine whether antiarrhythmic drug therapy is appropriate or if we can safely start anticoagulation.    -30 day event monitor  -FU with Dr. Acie [name] in 3 mos.   Chronic systolic heart failure (HCC) EF has improved to normal.  Continue beta-blocker, angiotensin  receptor blocker, furosemide.  Volume status is stable.  Mixed hyperlipidemia LDL optimal on most recent lab work.  Continue current Rx.    History of GI bleed   Hx of recurrent GI bleeding.  FU with GI (Dr. Dorrene German) as planned.   Dispo:  Return in about 3 months (around 04/04/2019) for Follow up after testing, w/ Dr. Acie [name].   Medication Adjustments/Labs and Tests Ordered: Current medicines are reviewed at length with the patient today.  Concerns regarding medicines are outlined above.  Tests Ordered: No orders of the defined types were placed in this encounter.  Medication Changes: No orders of the defined types were placed in this encounter.  Signed, Richardson Dopp, PA-C  01/03/2019 10:54 AM    [facility] [place], [place], [place]  [address] Phone: [phone]; Fax: [phone]

## 2019-01-03 NOTE — Patient Instructions (Signed)
Medication Instructions:  No changes.   If you need a refill on your cardiac medications before your next appointment, please call your pharmacy.   Lab work: None   If you have labs (blood work) drawn today and your tests are completely normal, you will receive your results only by: Marland Kitchen MyChart Message (if you have MyChart) OR . A paper copy in the mail If you have any lab test that is abnormal or we need to change your treatment, we will call you to review the results.  Testing/Procedures: Schedule 30 day event monitor  Follow-Up: At [facility], you and your health needs are our priority.  As part of our continuing mission to provide you with exceptional heart care, we have created designated Provider Care Teams.  These Care Teams include your primary Cardiologist (physician) and Advanced Practice Providers (APPs -  Physician Assistants and Nurse Practitioners) who all work together to provide you with the care you need, when you need it. . Dr. Liam Rogers in 3 months.  Any Other Special Instructions Will Be Listed Below (If Applicable).

## 2019-01-08 ENCOUNTER — Ambulatory Visit (INDEPENDENT_AMBULATORY_CARE_PROVIDER_SITE_OTHER): Payer: Medicare Other

## 2019-01-08 DIAGNOSIS — I480 Paroxysmal atrial fibrillation: Secondary | ICD-10-CM

## 2019-01-15 ENCOUNTER — Other Ambulatory Visit: Payer: Self-pay | Admitting: Cardiovascular Disease

## 2019-01-15 MED FILL — POTASSIUM CHLORIDE CRYS ER: 10 | 90 days supply | Qty: 90 | Fill #0 | Status: TO

## 2019-01-18 ENCOUNTER — Telehealth: Payer: Self-pay

## 2019-01-18 NOTE — Telephone Encounter (Signed)
Follow up ° ° °Patient is returning call per the previous message. °

## 2019-01-18 NOTE — Telephone Encounter (Signed)
Monitor notification fax received that on 01/17/19 at 10:12PM report analysis from an auto-trigger event shows: "sinus tachycardiac, sinus rhythm with 1st degree AV block/run of V-tach (5 beats)."   Called to see if the patient experienced symptoms.  Left message to call back.

## 2019-01-18 NOTE — Telephone Encounter (Signed)
I just spoke to the patient and he said that at 11:00 pm on 1/29, he was sleeping.  He was not alerted by any symptoms during this episode.  I told the patient that I would call if there were any recommendations per MD.

## 2019-01-19 NOTE — Telephone Encounter (Signed)
Dr. Acie Fredrickson reviewed monitor recording and does not recommend any changes in current therapy

## 2019-01-22 ENCOUNTER — Telehealth: Payer: Self-pay

## 2019-01-22 NOTE — Telephone Encounter (Signed)
Received monitor report for reading on 01/21/19 at 1:36 am - Sinus Rhythm, junctional rhythm with run of V-Tach (5 beats)/1st degree AV Block. Called patient. Patient stated he was sleeping at the time of report. Consulted DOD, Dr. Harrington Challenger, she suggested patient send in a current reading to see what patient is doing at this time. Called patient back. No answer. Left detailed message for patient to send his current report by pressing the button on his monitor. Asked patient to give our office a call if he had any questions on voice mail.

## 2019-01-22 NOTE — Telephone Encounter (Signed)
Patient called back and stated he sent in a report as directed. Informed patient if there are any changes our office will give him a call.

## 2019-01-23 ENCOUNTER — Telehealth: Payer: Self-pay | Admitting: Physician Assistant

## 2019-01-23 DIAGNOSIS — K250 Acute gastric ulcer with hemorrhage: Secondary | ICD-10-CM | POA: Diagnosis not present

## 2019-01-23 DIAGNOSIS — I480 Paroxysmal atrial fibrillation: Secondary | ICD-10-CM

## 2019-01-23 DIAGNOSIS — K2950 Unspecified chronic gastritis without bleeding: Secondary | ICD-10-CM | POA: Diagnosis not present

## 2019-01-23 DIAGNOSIS — K2930 Chronic superficial gastritis without bleeding: Secondary | ICD-10-CM | POA: Diagnosis not present

## 2019-01-23 DIAGNOSIS — K3189 Other diseases of stomach and duodenum: Secondary | ICD-10-CM | POA: Diagnosis not present

## 2019-01-23 DIAGNOSIS — Z8711 Personal history of peptic ulcer disease: Secondary | ICD-10-CM | POA: Diagnosis not present

## 2019-01-23 NOTE — Telephone Encounter (Signed)
Rhythm strip received from [employer] that showed an 8 beat run of Vtach on 2/3 at 2:37PM CST.  Called pt and he states that he did not feel any palps and did not experience any lightheadedness or dizziness.  Pt can't remember what he was doing around that time.  Advised I would speak to Richardson Dopp, PA-C and call back if any recommendations.    Spoke with Nicki Reaper and he said to have pt come in for BMET, Mg, and echo.    Spoke with pt and went over recommendations.  Pt agreeable with plan.  He will have labs drawn 2/6 and echo on 2/10.  Pt verbalized understanding and was appreciative for call.

## 2019-01-24 ENCOUNTER — Telehealth: Payer: Self-pay

## 2019-01-24 NOTE — Telephone Encounter (Signed)
Serious event received.  Pt with run of V-tach 5 beats on 01/22/2019 at 4:12 am.  Per Pt he was asleep at that time.  Pt has lab work scheduled for January 25, 2019 and ECHO on January 29, 2019.  Advised Pt to call office with any symptoms- dizziness, lightheadedness.  Pt indicates understanding.  Reviewed with SW-no additional rec's at this time.  Left information for Dr. Elmarie Shiley review.

## 2019-01-25 ENCOUNTER — Other Ambulatory Visit: Payer: Medicare Other

## 2019-01-25 DIAGNOSIS — I480 Paroxysmal atrial fibrillation: Secondary | ICD-10-CM | POA: Diagnosis not present

## 2019-01-25 LAB — BASIC METABOLIC PANEL
BUN/Creatinine Ratio: 17 (ref 10–24)
BUN: 16 mg/dL (ref 8–27)
CO2: 20 mmol/L (ref 20–29)
Calcium: 9.2 mg/dL (ref 8.6–10.2)
Chloride: 103 mmol/L (ref 96–106)
Creatinine, Ser: 0.93 mg/dL (ref 0.76–1.27)
GFR calc Af Amer: 96 mL/min/{1.73_m2} (ref >59)
GFR calc non Af Amer: 83 mL/min/{1.73_m2} (ref >59)
Glucose: 104 mg/dL — ABNORMAL HIGH (ref 65–99)
Potassium: 4.4 mmol/L (ref 3.5–5.2)
Sodium: 140 mmol/L (ref 134–144)

## 2019-01-25 LAB — MAGNESIUM: Magnesium: 2.2 mg/dL (ref 1.6–2.3)

## 2019-01-29 ENCOUNTER — Ambulatory Visit (HOSPITAL_COMMUNITY): Payer: Medicare Other | Attending: Cardiology

## 2019-01-29 ENCOUNTER — Encounter: Payer: Self-pay | Admitting: Physician Assistant

## 2019-01-29 DIAGNOSIS — I480 Paroxysmal atrial fibrillation: Secondary | ICD-10-CM | POA: Insufficient documentation

## 2019-02-01 ENCOUNTER — Telehealth: Payer: Self-pay | Admitting: *Deleted

## 2019-02-01 DIAGNOSIS — I472 Ventricular tachycardia, unspecified: Secondary | ICD-10-CM

## 2019-02-01 DIAGNOSIS — I2510 Atherosclerotic heart disease of native coronary artery without angina pectoris: Secondary | ICD-10-CM

## 2019-02-01 NOTE — Telephone Encounter (Signed)
Serious event faxed to the office on this pt from 02/01/19 at 0211 AM. Event showed that the pt was in sinus rhythm, sinus tach w/run of V-tach (5 beats)/atrial run.  Pt HR at that time was 197, then returned to 100 bpm thereafter.  Called the pt and he states he was sleeping at that time and was completely asymptomatic.  Pt states he is asymptomatic right now, doing his daily morning routine.  Pt recently had an echo done on 01/29/19, showing the following results below:  Notes recorded by Liliane Shi, PA-C on 01/29/2019 at 2:33 PM EST Please call patient. The echocardiogram shows fairly stable heart function. The ejection fraction is 45-50%. There is no significant valvular dz.  Recommendations: - Continue current medications and follow up as planned.   Endorsed to the pt that I will go and speak with our DOD Dr Angelena Form and show him the event monitor recording, and follow-up with the pt thereafter with further recommendations.  Pt verbalized understanding and agrees with this plan.

## 2019-02-01 NOTE — Telephone Encounter (Signed)
Showed the DOD Dr Angelena Form the pts monitor and per Dr Angelena Form, continue to monitor and continue current regimen, and forward this encounter to both Dr Acie Fredrickson and ordering Provider Richardson Dopp PA-C.  Also spoke back with the pt and informed him that per our DOD, we will continue to monitor and he should continue his current med regimen.  Pt verbalized understanding and agrees with this plan. Will have this pts monitor scanned into his chart.

## 2019-02-01 NOTE — Telephone Encounter (Signed)
Thank you.  Please make sure Dr. Acie Fredrickson is notified and sees the strips. Richardson Dopp, PA-C    02/01/2019 11:42 AM

## 2019-02-01 NOTE — Telephone Encounter (Signed)
Will place the pts event monitor in Dr Elmarie Shiley mailbox vs scanning into the chart, as advised by Richardson Dopp PA-C.

## 2019-02-02 NOTE — Telephone Encounter (Signed)
He has occasioinal episodes of non sustained VT. He is on Coreg 25 mg BID These episodes are asymptomatic. Lets get a Lexiscan myoview to make sure he is not having ischemia.  Please set up an appt wit me or app in the next week or 2

## 2019-02-02 NOTE — Telephone Encounter (Signed)
Called patient and reviewed Dr. Elmarie Shiley advice with him. He is scheduled for lexiscan for 2/20. I reviewed pre-procedure instructions with him and he verbalized understanding and agreement. He thanked me for the call.

## 2019-02-02 NOTE — Addendum Note (Signed)
Addended by: Emmaline Life on: 02/02/2019 10:28 AM   Modules accepted: Orders

## 2019-02-05 ENCOUNTER — Telehealth: Payer: Self-pay | Admitting: Student

## 2019-02-05 NOTE — Telephone Encounter (Signed)
   Received page from [employer] that patient had 6 seconds of non-sustained VT (17 beats) this afternoon around 4pm before returning to sinus rhythm with heart rate in the 70's. Asked [employer] to fax strip to cath lab. Per chart review, patient has been having short runs of non-sustained VT. Called and spoke with patient. He states he was completely asymptomatic at the time and was sitting in his chair watching TV. No chest pain, shortness of breath, palpitations, lightheadedness, or dizziness. Patient is scheduled for Lexiscan on 02/08/2019. Advised patient to continue current Coreg dose and to continue to wear monitor. If patient becomes symptomatic, he was advised to call our office or come to the ED. Advised patient not to dry if he is symptomatic. Patient voiced understanding.   Darreld Mclean, PA-C 02/05/2019 7:05 PM

## 2019-02-06 ENCOUNTER — Telehealth (HOSPITAL_COMMUNITY): Payer: Self-pay

## 2019-02-06 NOTE — Telephone Encounter (Signed)
Patient contacted and given detailed instructions about his test. S.Kyara Boxer EMTP

## 2019-02-08 ENCOUNTER — Encounter (HOSPITAL_COMMUNITY): Payer: Medicare Other

## 2019-02-08 ENCOUNTER — Ambulatory Visit (HOSPITAL_COMMUNITY): Payer: Medicare Other | Attending: Cardiovascular Disease

## 2019-02-08 DIAGNOSIS — I472 Ventricular tachycardia, unspecified: Secondary | ICD-10-CM

## 2019-02-08 DIAGNOSIS — I2510 Atherosclerotic heart disease of native coronary artery without angina pectoris: Secondary | ICD-10-CM | POA: Insufficient documentation

## 2019-02-08 LAB — MYOCARDIAL PERFUSION IMAGING
LV dias vol: 118 mL (ref 62–150)
LV sys vol: 58 mL
NUC STRESS TID: 0.91
Peak HR: 88 {beats}/min
Rest HR: 70 {beats}/min
SDS: 0
SRS: 0
SSS: 0

## 2019-02-08 MED ORDER — TECHNETIUM TC 99M TETROFOSMIN IV KIT
11.0000 | PACK | Freq: Once | INTRAVENOUS | Status: AC | PRN
  Administered 2019-02-08: 11 via INTRAVENOUS
  Filled 2019-02-08: qty 11

## 2019-02-08 MED ORDER — TECHNETIUM TC 99M TETROFOSMIN IV KIT
32.4000 | PACK | Freq: Once | INTRAVENOUS | Status: AC | PRN
  Administered 2019-02-08: 32.4 via INTRAVENOUS
  Filled 2019-02-08: qty 33

## 2019-02-08 MED ORDER — REGADENOSON 0.4 MG/5ML IV SOLN
0.4000 mg | Freq: Once | INTRAVENOUS | Status: AC
  Administered 2019-02-08: 0.4 mg via INTRAVENOUS

## 2019-02-12 DIAGNOSIS — R972 Elevated prostate specific antigen [PSA]: Secondary | ICD-10-CM | POA: Diagnosis not present

## 2019-03-08 ENCOUNTER — Telehealth: Payer: Self-pay | Admitting: Cardiovascular Disease

## 2019-03-08 ENCOUNTER — Encounter: Payer: Self-pay | Admitting: Cardiovascular Disease

## 2019-03-08 ENCOUNTER — Ambulatory Visit (INDEPENDENT_AMBULATORY_CARE_PROVIDER_SITE_OTHER): Payer: Medicare Other | Admitting: Cardiovascular Disease

## 2019-03-08 ENCOUNTER — Other Ambulatory Visit: Payer: Self-pay

## 2019-03-08 VITALS — BP 118/62 | HR 77 | Ht 72.0 in | Wt 196.8 lb

## 2019-03-08 DIAGNOSIS — I2510 Atherosclerotic heart disease of native coronary artery without angina pectoris: Secondary | ICD-10-CM | POA: Diagnosis not present

## 2019-03-08 DIAGNOSIS — E782 Mixed hyperlipidemia: Secondary | ICD-10-CM | POA: Diagnosis not present

## 2019-03-08 DIAGNOSIS — I5022 Chronic systolic (congestive) heart failure: Secondary | ICD-10-CM | POA: Diagnosis not present

## 2019-03-08 MED ORDER — CARVEDILOL 25 MG PO TABS: 25.0000 mg | ORAL_TABLET | Freq: Two times a day (BID) | ORAL | 3 refills | Status: DC

## 2019-03-08 MED ORDER — LOSARTAN POTASSIUM 100 MG PO TABS: 100.0000 mg | ORAL_TABLET | Freq: Every day | ORAL | 3 refills | Status: DC

## 2019-03-08 MED ORDER — ATORVASTATIN CALCIUM 20 MG PO TABS: 20.0000 mg | ORAL_TABLET | Freq: Every day | ORAL | 3 refills | Status: DC

## 2019-03-08 MED ORDER — FUROSEMIDE 20 MG PO TABS: 20.0000 mg | ORAL_TABLET | Freq: Every day | ORAL | 3 refills | Status: DC

## 2019-03-08 MED ORDER — POTASSIUM CHLORIDE ER 10 MEQ PO TBCR: 10.0000 meq | EXTENDED_RELEASE_TABLET | Freq: Every day | ORAL | 3 refills | Status: DC

## 2019-03-08 NOTE — Telephone Encounter (Signed)
Spoke with patient and thanked him for the information regarding his medications. I advised that we will postpone lab work until May due to threat of Covid 19. Patient verbalized understanding and agreement and thanked me for the call.

## 2019-03-08 NOTE — Telephone Encounter (Signed)
Follow Up:     Pt said please do not weary abou sending his refills to [facility] at this time.He said he is going to have all his medicine transferred to [place], until this Virus is over.

## 2019-03-08 NOTE — Patient Instructions (Signed)
Medication Instructions:  Your physician recommends that you continue on your current medications as directed. Please refer to the Current Medication list given to you today.  If you need a refill on your cardiac medications before your next appointment, please call your pharmacy.   Lab work: Your physician recommends that you return for lab work TOMORROW  You will need to FAST for this appointment - nothing to eat or drink after midnight the night before except water.   If you have labs (blood work) drawn today and your tests are completely normal, you will receive your results only by: Marland Kitchen MyChart Message (if you have MyChart) OR . A paper copy in the mail If you have any lab test that is abnormal or we need to change your treatment, we will call you to review the results.    Testing/Procedures: None Ordered   Follow-Up: At [facility], you and your health needs are our priority.  As part of our continuing mission to provide you with exceptional heart care, we have created designated Provider Care Teams.  These Care Teams include your primary Cardiologist (physician) and Advanced Practice Providers (APPs -  Physician Assistants and Nurse Practitioners) who all work together to provide you with the care you need, when you need it. You will need a follow up appointment in:  6 months.  Please call our office 2 months in advance to schedule this appointment.  You may see Mertie Moores, MD or one of the following Advanced Practice Providers on your designated Care Team: Richardson Dopp, PA-C [place], [place] . Daune Perch, NP

## 2019-03-08 NOTE — Progress Notes (Signed)
Cardiology Office Note   Date:  03/08/2019   ID:  Mike [name], DOB 03-03-1951, MRN [id number]  PCP:  Osie Cheeks, PA-C  Cardiologist:   Mertie Moores, MD   Chief Complaint  Patient presents with  . Congestive Heart Failure   1. Congestive heart failure-original ejection fraction equals 25-30% - improved to 50%. . minor coronary artery irregularities by heart catheterization 2. Neurofibromatosis 3. Hx of GI bleed   History of Present Illness: Mike [name] is a 68 year old gentleman with a history of congestive heart failure. Cardiac catheterization performed 12/01/2010 reveals mild coronary artery irregularities. His ejection fraction is around 50%. He's not had any episodes of chest pain or shortness of breath.  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   The cost of his Diovan will be increasing after the first of the year. He would like to change to losartan.  July 04, 2013:  Mike [name] is doing well. He has not had any problems  01/16/2014:  Mike [name] is doing well. Still works Land at [employer]. He walks several miles a day in the course of his work.   August 13, 2014:  Mike [name] is doing well. No CP or dyspnea. No PND or orthopnea. No DOE Does stay fatigued. Does not sleep well at night. He does not have a medical doctor    Mike [name] is a 68 y.o. male who presents for follow up of his CHF  He remains quite active. He works as a Presenter, broadcasting at [employer]. He walks many miles throughout the course of his workday. He denies any chest pain or shortness breath.  08/21/2015:  Doing well.  Still working security at [employer] about retiring in June  Of 2017. No CP , no dyspnea.   February 25, 2016:  Mike [name] is doing  Well. 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  Is planning on retiring in June .   Wife  Mike [name]) retired recently .  Oct. 9, 2017:  Mike [name] has retired since  I last saw him Not exercising as much   Breathing is good.   No CP   May 13, 2017  Mike [name] is seen today for follow up of his CHF Is enjoying retirement. Encouraged him to get some hobbies.  No CP or dyspnea.  Lipid profile looks great   Jan. 14, 2019:  Doing  Well.   Some exercise , doesn't do much since retiring from [employer].  No CP or dyspnea.    Sept. 4, 2019:  Mike [name] is seen today for follow of his congestive heart failure: No problems , Now goes to [facility] - Lupita Leash, MD  Was found to have an elevated PSA.   Has seen urology .   No plans for  Not walking as much as he should   March 08, 2019: Seen today for follow-up of his congestive heart failure.  He had an event monitor for some palpitations and was found to have some episodes of nonsustained ventricular tachycardia.  Echocardiogram reveals stable left ventricular systolic function with an ejection fraction of 45 to 50%.  [place] study showed no ischemia and mildly reduced left ventricular function.  He has not had any dizziness or CP ,  Breathing is good Is exercising 2-3 days a week .   Wears his wifes fitbit.   Tries  to walk 10,000 steps a  Past Medical History:  Diagnosis Date  . CHF (congestive heart failure) (HCC)    Ejection Fraction 25-30% // Echo 01/2019: EF 45-50, mild LVH, impaired diastolic relaxation, global HK  . History of GI bleed   . Neurofibromatosis   . SOB (shortness of breath)     Past Surgical History:  Procedure Laterality Date  . ESOPHAGOGASTRODUODENOSCOPY  08/14/2007   With epinepherine and bipolar electrocoagulation therapy  . OTHER SURGICAL HISTORY     Splenectomy     Current Outpatient Medications  Medication Sig Dispense Refill  . atorvastatin (LIPITOR) 20 MG tablet Take 1 tablet (20 mg total) by mouth daily. 90 tablet 3  . carvedilol (COREG) 25 MG tablet Take 1 tablet (25 mg total) by mouth 2 (two) times daily with a meal. 180 tablet 3   . ferrous gluconate (FERGON) 324 MG tablet Take 324 mg by mouth 2 (two) times daily with a meal.    . furosemide (LASIX) 20 MG tablet Take 1 tablet (20 mg total) by mouth daily. 90 tablet 3  . loratadine (CLARITIN) 10 MG tablet Take 10 mg by mouth daily.    Marland Kitchen losartan (COZAAR) 100 MG tablet Take 1 tablet (100 mg total) by mouth daily. 90 tablet 3  . Multiple Vitamins-Minerals (CENTRUM SILVER PO) Take 1 tablet by mouth daily.     . Omega-3 1000 MG CAPS Take 57 mg by mouth 2 (two) times daily.     . pantoprazole (PROTONIX) 40 MG tablet Take 40 mg by mouth daily.     . potassium chloride (K-DUR) 10 MEQ tablet Take 1 tablet (10 mEq total) by mouth daily. 90 tablet 3   No current facility-administered medications for this visit.     Allergies:   Sulfonamide derivatives    Social History:  The patient  reports that he has never smoked. He has never used smokeless tobacco. He reports that he does not drink alcohol or use drugs.   Family History:  The patient's family history includes Cancer in his mother; Stroke in his father.    ROS: Noted in current history.  Otherwise systems are negative.   Physical Exam: Blood pressure 118/62, pulse 77, height 6' (1.829 m), weight 196 lb 12.8 oz (89.3 kg), SpO2 98 %.  GEN:  Well nourished, well developed in no acute distress HEENT: Normal NECK: No JVD; No carotid bruits LYMPHATICS: No lymphadenopathy CARDIAC: RRR , no murmurs, rubs, gallops RESPIRATORY:  Clear to auscultation without rales, wheezing or rhonchi  ABDOMEN: Soft, non-tender, non-distended MUSCULOSKELETAL:  No edema; No deformity  SKIN: Warm and dry NEUROLOGIC:  Alert and oriented x 3   EKG:     Recent Labs: 01/25/2019: BUN 16; Creatinine, Ser 0.93; Magnesium 2.2; Potassium 4.4; Sodium 140    Lipid Panel    Component Value Date/Time   CHOL 143 01/02/2018 1049   TRIG 78 01/02/2018 1049   HDL 73 01/02/2018 1049   CHOLHDL 2.0 01/02/2018 1049   CHOLHDL 2.1 02/25/2016 0932    VLDL 12 02/25/2016 0932   LDLCALC 54 01/02/2018 1049      Wt Readings from Last 3 Encounters:  03/08/19 196 lb 12.8 oz (89.3 kg)  02/08/19 196 lb (88.9 kg)  01/03/19 196 lb (88.9 kg)      Other studies Reviewed: Additional studies/ records that were reviewed today include: . Review of the above records demonstrates:    ASSESSMENT AND PLAN:  1. Congestive heart failure-   2. Neurofibromatosis -  progressive  3. Hx of GI bleed -   4. Hyperlipidemia:    He had labs drawn at his primary medical doctors.  These are available in care everywhere.  His last LDL is 73.  Total cholesterol is 147.  HDL is 69.  Triglyceride level is 70.   Current medicines are reviewed at length with the patient today.  The patient does not have concerns regarding medicines.  The following changes have been made:  no change   Disposition:   FU with me in 6 months .    Signed, Mertie Moores, MD  03/08/2019 11:49 AM    [facility] [place], [place], [place]  [address] Phone: [phone]; Fax: [phone]

## 2019-03-09 ENCOUNTER — Other Ambulatory Visit: Payer: Medicare Other

## 2019-03-13 MED FILL — FUROSEMIDE 20 MG TABS: 20 | 90 days supply | Qty: 90 | Fill #0

## 2019-03-13 MED FILL — ATORVASTATIN 20 MG TABLET: 20 | 90 days supply | Qty: 90 | Fill #0

## 2019-03-13 MED FILL — LOSARTAN POTASSIUM 100 MG T: 100 | 90 days supply | Qty: 90 | Fill #0

## 2019-03-13 MED FILL — CARVEDILOL 25 MG TABLET: 25 | 90 days supply | Qty: 180 | Fill #0

## 2019-03-28 MED FILL — PANTOPRAZOLE SOD DR 40 MG T: 40 | 30 days supply | Qty: 30 | Fill #1

## 2019-04-05 ENCOUNTER — Ambulatory Visit: Payer: Medicare Other | Admitting: Cardiovascular Disease

## 2019-04-09 MED FILL — POTASSIUM CHL ER M10 TABLET: 10 | 90 days supply | Qty: 90 | Fill #0 | Status: TO

## 2019-04-25 ENCOUNTER — Other Ambulatory Visit: Payer: Medicare Other | Admitting: *Deleted

## 2019-04-25 ENCOUNTER — Other Ambulatory Visit: Payer: Self-pay

## 2019-04-25 DIAGNOSIS — I2510 Atherosclerotic heart disease of native coronary artery without angina pectoris: Secondary | ICD-10-CM

## 2019-04-25 DIAGNOSIS — I5022 Chronic systolic (congestive) heart failure: Secondary | ICD-10-CM

## 2019-04-25 DIAGNOSIS — E782 Mixed hyperlipidemia: Secondary | ICD-10-CM | POA: Diagnosis not present

## 2019-04-25 LAB — BASIC METABOLIC PANEL
BUN/Creatinine Ratio: 18 (ref 10–24)
BUN: 19 mg/dL (ref 8–27)
CO2: 22 mmol/L (ref 20–29)
Calcium: 9.2 mg/dL (ref 8.6–10.2)
Chloride: 103 mmol/L (ref 96–106)
Creatinine, Ser: 1.03 mg/dL (ref 0.76–1.27)
GFR calc Af Amer: 85 mL/min/{1.73_m2} (ref >59)
GFR calc non Af Amer: 74 mL/min/{1.73_m2} (ref >59)
Glucose: 102 mg/dL — ABNORMAL HIGH (ref 65–99)
Potassium: 4.2 mmol/L (ref 3.5–5.2)
Sodium: 142 mmol/L (ref 134–144)

## 2019-04-25 LAB — HEPATIC FUNCTION PANEL
ALT: 30 IU/L (ref 0–44)
AST: 23 IU/L (ref 0–40)
Albumin: 4.2 g/dL (ref 3.8–4.8)
Alkaline Phosphatase: 83 IU/L (ref 39–117)
Bilirubin Total: 0.6 mg/dL (ref 0.0–1.2)
Bilirubin, Direct: 0.18 mg/dL (ref 0.00–0.40)
Total Protein: 6.7 g/dL (ref 6.0–8.5)

## 2019-04-25 LAB — LIPID PANEL
Chol/HDL Ratio: 2.1 ratio (ref 0.0–5.0)
Cholesterol, Total: 152 mg/dL (ref 100–199)
HDL: 74 mg/dL (ref >39)
LDL Calculated: 66 mg/dL (ref 0–99)
Triglycerides: 60 mg/dL (ref 0–149)
VLDL Cholesterol Cal: 12 mg/dL (ref 5–40)

## 2019-06-05 DIAGNOSIS — H2513 Age-related nuclear cataract, bilateral: Secondary | ICD-10-CM | POA: Diagnosis not present

## 2019-06-12 ENCOUNTER — Telehealth: Payer: Self-pay | Admitting: Cardiovascular Disease

## 2019-06-12 MED ORDER — FUROSEMIDE 20 MG PO TABS: 20.0000 mg | ORAL_TABLET | Freq: Every day | ORAL | 3 refills | Status: DC

## 2019-06-12 MED ORDER — ATORVASTATIN CALCIUM 20 MG PO TABS: 20.0000 mg | ORAL_TABLET | Freq: Every day | ORAL | 3 refills | Status: DC

## 2019-06-12 MED ORDER — CARVEDILOL 25 MG PO TABS: 25.0000 mg | ORAL_TABLET | Freq: Two times a day (BID) | ORAL | 3 refills | Status: DC

## 2019-06-12 MED ORDER — POTASSIUM CHLORIDE ER 10 MEQ PO TBCR: 10.0000 meq | EXTENDED_RELEASE_TABLET | Freq: Every day | ORAL | 3 refills | Status: DC

## 2019-06-12 MED ORDER — LOSARTAN POTASSIUM 100 MG PO TABS: 100.0000 mg | ORAL_TABLET | Freq: Every day | ORAL | 3 refills | Status: DC

## 2019-06-12 NOTE — Telephone Encounter (Signed)
Lm to call back not sure which med pt needs hard script for ./cy

## 2019-06-12 NOTE — Telephone Encounter (Signed)
New message:    Patient calling stating he needs a hard scrip for his medication to take to [place]. To be filled. Patient states that the doctor and nurse what to do. Please call patient.

## 2019-06-13 ENCOUNTER — Other Ambulatory Visit: Payer: Self-pay

## 2019-06-13 MED ORDER — ATORVASTATIN CALCIUM 20 MG PO TABS: 20.0000 mg | ORAL_TABLET | Freq: Every day | ORAL | 3 refills | Status: DC

## 2019-06-13 MED ORDER — CARVEDILOL 25 MG PO TABS: 25.0000 mg | ORAL_TABLET | Freq: Two times a day (BID) | ORAL | 3 refills | Status: DC

## 2019-06-13 MED ORDER — LOSARTAN POTASSIUM 100 MG PO TABS: 100.0000 mg | ORAL_TABLET | Freq: Every day | ORAL | 3 refills | Status: DC

## 2019-06-13 MED ORDER — FUROSEMIDE 20 MG PO TABS: 20.0000 mg | ORAL_TABLET | Freq: Every day | ORAL | 3 refills | Status: DC

## 2019-06-13 MED ORDER — POTASSIUM CHLORIDE ER 10 MEQ PO TBCR: 10.0000 meq | EXTENDED_RELEASE_TABLET | Freq: Every day | ORAL | 3 refills | Status: DC

## 2019-06-13 NOTE — Telephone Encounter (Signed)
I spoke to the patient and printed out his prescriptions.  I had Richardson Dopp, [place] sign them and the patient will pick up Thursday.

## 2019-07-20 ENCOUNTER — Other Ambulatory Visit: Payer: Self-pay

## 2019-08-07 MED FILL — POTASSIUM CHL ER M10 TABLET: 10 | 90 days supply | Qty: 90 | Fill #1 | Status: TO

## 2019-08-24 DIAGNOSIS — I5022 Chronic systolic (congestive) heart failure: Secondary | ICD-10-CM | POA: Diagnosis not present

## 2019-08-24 DIAGNOSIS — I480 Paroxysmal atrial fibrillation: Secondary | ICD-10-CM | POA: Diagnosis not present

## 2019-08-24 DIAGNOSIS — E782 Mixed hyperlipidemia: Secondary | ICD-10-CM | POA: Diagnosis not present

## 2019-08-24 DIAGNOSIS — Z0000 Encounter for general adult medical examination without abnormal findings: Secondary | ICD-10-CM | POA: Diagnosis not present

## 2019-08-24 DIAGNOSIS — Q8500 Neurofibromatosis, unspecified: Secondary | ICD-10-CM | POA: Diagnosis not present

## 2019-08-24 DIAGNOSIS — R972 Elevated prostate specific antigen [PSA]: Secondary | ICD-10-CM | POA: Diagnosis not present

## 2019-10-02 ENCOUNTER — Other Ambulatory Visit: Payer: Self-pay

## 2019-10-02 ENCOUNTER — Encounter: Payer: Self-pay | Admitting: Cardiovascular Disease

## 2019-10-02 ENCOUNTER — Telehealth: Payer: Self-pay

## 2019-10-02 ENCOUNTER — Ambulatory Visit (INDEPENDENT_AMBULATORY_CARE_PROVIDER_SITE_OTHER): Payer: Medicare Other | Admitting: Cardiovascular Disease

## 2019-10-02 VITALS — BP 136/82 | HR 73 | Ht 72.0 in | Wt 194.8 lb

## 2019-10-02 DIAGNOSIS — I2510 Atherosclerotic heart disease of native coronary artery without angina pectoris: Secondary | ICD-10-CM

## 2019-10-02 DIAGNOSIS — I5022 Chronic systolic (congestive) heart failure: Secondary | ICD-10-CM | POA: Diagnosis not present

## 2019-10-02 NOTE — Patient Instructions (Addendum)
Medication Instructions:   If you need a refill on your cardiac medications before your next appointment, please call your pharmacy.   Lab work:  If you have labs (blood work) drawn today and your tests are completely normal, you will receive your results only by: Marland Kitchen MyChart Message (if you have MyChart) OR . A paper copy in the mail If you have any lab test that is abnormal or we need to change your treatment, we will call you to review the results.  Testing/Procedures: None ordered today.   Follow-Up: At [facility], you and your health needs are our priority.  As part of our continuing mission to provide you with exceptional heart care, we have created designated Provider Care Teams.  These Care Teams include your primary Cardiologist (physician) and Advanced Practice Providers (APPs -  Physician Assistants and Nurse Practitioners) who all work together to provide you with the care you need, when you need it. You will need a follow up appointment in:  12 months.  Please call our office 2 months in advance to schedule this appointment.  You may see Mertie Moores, MD or one of the following Advanced Practice Providers on your designated Care Team: Richardson Dopp, PA-C [place], [place] . Daune Perch, NP

## 2019-10-02 NOTE — Telephone Encounter (Signed)
Patient's pharmacy is [facility] ( [facility] name). Number for patient's pharmacy is [phone] (option 4, option 1). Pharmacy NCPDP/NPI number [id number]. This was given to our office today while patient was at his office visit.

## 2019-10-02 NOTE — Progress Notes (Signed)
Cardiology Office Note   Date:  10/02/2019   ID:  Mike [name], DOB 04/22/1951, MRN [id number]  PCP:  Mike Cheeks, PA-C  Cardiologist:   Mike Moores, MD   No chief complaint on file.  1. Congestive heart failure-original ejection fraction equals 25-30% - improved to 50%. . minor coronary artery irregularities by heart catheterization 2. Neurofibromatosis 3. Hx of GI bleed    Mike [name] is a 68 year old gentleman with a history of congestive heart failure. Cardiac catheterization performed 12/01/2010 reveals mild coronary artery irregularities. His ejection fraction is around 50%. He's not had any episodes of chest pain or shortness of breath.  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   The cost of his Diovan will be increasing after the first of the year. He would like to change to losartan.  July 04, 2013:  Mike [name] is doing well. He has not had any problems  01/16/2014:  Mike [name] is doing well. Still works Land at [employer]. He walks several miles a day in the course of his work.   August 13, 2014:  Mike [name] is doing well. No CP or dyspnea. No PND or orthopnea. No DOE Does stay fatigued. Does not sleep well at night. He does not have a medical doctor    Mike [name] is a 68 y.o. male who presents for follow up of his CHF  He remains quite active. He works as a Presenter, broadcasting at [employer]. He walks many miles throughout the course of his workday. He denies any chest pain or shortness breath.  08/21/2015:  Doing well.  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 No CP , no dyspnea.   February 25, 2016:  Mike [name] is doing  Well. 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  Is planning on retiring in June .   Wife  Mike [name]) retired recently .  Oct. 9, 2017:  Mike [name] has retired since I last saw him Not exercising as much   Breathing is good.   No  CP   May 13, 2017  Mike [name] is seen today for follow up of his CHF Is enjoying retirement. Encouraged him to get some hobbies.  No CP or dyspnea.  Lipid profile looks great   Jan. 14, 2019:  Doing  Well.   Some exercise , doesn't do much since retiring from [employer].  No CP or dyspnea.    Sept. 4, 2019:  Mike [name] is seen today for follow of his congestive heart failure: No problems , Now goes to [facility] - Mike Leash, MD  Was found to have an elevated PSA.   Has seen urology .   No plans for  Not walking as much as he should   March 08, 2019: Seen today for follow-up of his congestive heart failure.  He had an event monitor for some palpitations and was found to have some episodes of nonsustained ventricular tachycardia.  Echocardiogram reveals stable left ventricular systolic function with an ejection fraction of 45 to 50%.  [place] study showed no ischemia and mildly reduced left ventricular function.  He has not had any dizziness or CP ,  Breathing is good Is exercising 2-3 days a week .   Wears his wifes fitbit.   Tries to walk 10,000 steps a  October 02, 2019:  Mike [name] is seen today for follow-up of his congestive heart failure.  He has had some palpitations in the past has been found to have nonsustained ventricular tachycardia.  Last echocardiogram reveals an ejection fraction of 45 to 50%.  Lexiscan Myoview study has showed no evidence of ischemia.  VS look great  stays busy .  Walks several days a week   Past Medical History:  Diagnosis Date  . CHF (congestive heart failure) (HCC)    Ejection Fraction 25-30% // Echo 01/2019: EF 45-50, mild LVH, impaired diastolic relaxation, global HK  . History of GI bleed   . Neurofibromatosis   . SOB (shortness of breath)     Past Surgical History:  Procedure Laterality Date  . ESOPHAGOGASTRODUODENOSCOPY  08/14/2007   With epinepherine and bipolar electrocoagulation therapy  . OTHER SURGICAL  HISTORY     Splenectomy     Current Outpatient Medications  Medication Sig Dispense Refill  . atorvastatin (LIPITOR) 20 MG tablet Take 1 tablet (20 mg total) by mouth daily. 90 tablet 3  . carvedilol (COREG) 25 MG tablet Take 1 tablet (25 mg total) by mouth 2 (two) times daily with a meal. 180 tablet 3  . ferrous gluconate (FERGON) 324 MG tablet Take 324 mg by mouth 2 (two) times daily with a meal.    . furosemide (LASIX) 20 MG tablet Take 1 tablet (20 mg total) by mouth daily. 90 tablet 3  . loratadine (CLARITIN) 10 MG tablet Take 10 mg by mouth daily.    Marland Kitchen losartan (COZAAR) 100 MG tablet Take 1 tablet (100 mg total) by mouth daily. 90 tablet 3  . Multiple Vitamins-Minerals (CENTRUM SILVER PO) Take 1 tablet by mouth daily.     . Omega-3 1000 MG CAPS Take 57 mg by mouth 2 (two) times daily.     . pantoprazole (PROTONIX) 40 MG tablet Take 40 mg by mouth daily.     . potassium chloride (K-DUR) 10 MEQ tablet Take 1 tablet (10 mEq total) by mouth daily. 90 tablet 3   No current facility-administered medications for this visit.     Allergies:   Sulfonamide derivatives    Social History:  The patient  reports that he has never smoked. He has never used smokeless tobacco. He reports that he does not drink alcohol or use drugs.   Family History:  The patient's family history includes Cancer in his mother; Stroke in his father.    ROS: Noted in current history.  Otherwise systems are negative.   Physical Exam: Blood pressure 136/82, pulse 73, height 6' (1.829 m), weight 194 lb 12.8 oz (88.4 kg), SpO2 95 %.  GEN:  Well nourished, well developed in no acute distress,  Extensive neurofibromatosis  HEENT: Normal NECK: No JVD; No carotid bruits LYMPHATICS: No lymphadenopathy CARDIAC: RRR , no murmurs, rubs, gallops RESPIRATORY:  Clear to auscultation without rales, wheezing or rhonchi  ABDOMEN: Soft, non-tender, non-distended MUSCULOSKELETAL:  No edema; No deformity  SKIN:  neurofibromatosis   NEUROLOGIC:  Alert and oriented x 3   EKG:     Recent Labs: 01/25/2019: Magnesium 2.2 04/25/2019: ALT 30; BUN 19; Creatinine, Ser 1.03; Potassium 4.2; Sodium 142    Lipid Panel    Component Value Date/Time   CHOL 152 04/25/2019 0920   TRIG 60 04/25/2019 0920   HDL 74 04/25/2019 0920   CHOLHDL 2.1 04/25/2019 0920   CHOLHDL 2.1 02/25/2016 0932   VLDL 12 02/25/2016 0932   LDLCALC 66 04/25/2019 0920  Wt Readings from Last 3 Encounters:  10/02/19 194 lb 12.8 oz (88.4 kg)  03/08/19 196 lb 12.8 oz (89.3 kg)  02/08/19 196 lb (88.9 kg)      Other studies Reviewed: Additional studies/ records that were reviewed today include: . Review of the above records demonstrates:    ASSESSMENT AND PLAN:  1. Congestive heart failure-  Doing well.   Needs to watch his salt.   conti meds  2. Neurofibromatosis  - slowly progressive   3. Hx of GI bleed -   4. Hyperlipidemia:   Continue atorvastatin  Lipid levels drawn in May reveals a total cholesterol of 152.  His HDL is 74.  LDL is 66.  His triglyceride level is 60.  Glucose level is 102.   Current medicines are reviewed at length with the patient today.  The patient does not have concerns regarding medicines.  The following changes have been made:  no change   Disposition:   FU with me in 1 year  .    Signed, Mike Moores, MD  10/02/2019 3:01 PM    [facility] [place], [place], [place]  [address] Phone: [phone]; Fax: [phone]

## 2019-10-11 MED ORDER — ATORVASTATIN CALCIUM 20 MG PO TABS: 20.0000 mg | ORAL_TABLET | Freq: Every day | ORAL | 3 refills | Status: DC

## 2019-10-11 MED ORDER — FUROSEMIDE 20 MG PO TABS: 20.0000 mg | ORAL_TABLET | Freq: Every day | ORAL | 3 refills | Status: DC

## 2019-10-11 MED ORDER — PANTOPRAZOLE SODIUM 40 MG PO TBEC: 40.0000 mg | DELAYED_RELEASE_TABLET | Freq: Every day | ORAL | 3 refills | Status: DC

## 2019-10-11 MED ORDER — CARVEDILOL 25 MG PO TABS: 25.0000 mg | ORAL_TABLET | Freq: Two times a day (BID) | ORAL | 3 refills | Status: DC

## 2019-10-11 MED ORDER — LOSARTAN POTASSIUM 100 MG PO TABS: 100.0000 mg | ORAL_TABLET | Freq: Every day | ORAL | 3 refills | Status: DC

## 2019-10-11 MED ORDER — POTASSIUM CHLORIDE ER 10 MEQ PO TBCR: 10.0000 meq | EXTENDED_RELEASE_TABLET | Freq: Every day | ORAL | 3 refills | Status: DC

## 2019-10-11 NOTE — Addendum Note (Signed)
Addended by: Derl Barrow on: 10/11/2019 11:14 AM   Modules accepted: Orders

## 2019-10-11 NOTE — Telephone Encounter (Signed)
Pt's medications were sent to his pharmacy in [place], found pharmacy by zip code [phone]. Called pt to inform him that all his medications were sent to his pharmacy as requested and if he has any other problems, questions or concerns, to call the office. Pt verbalized understanding.

## 2020-10-20 ENCOUNTER — Other Ambulatory Visit: Payer: Self-pay | Admitting: Cardiovascular Disease

## 2020-10-20 MED ORDER — LOSARTAN POTASSIUM 100 MG PO TABS: 100.0000 mg | ORAL_TABLET | Freq: Every day | ORAL | 0 refills | Status: DC

## 2020-10-20 MED ORDER — PANTOPRAZOLE SODIUM 40 MG PO TBEC
40.0000 mg | DELAYED_RELEASE_TABLET | Freq: Every day | ORAL | 0 refills | Status: DC
Start: 2020-10-20 — End: 2020-11-11

## 2020-10-20 MED ORDER — POTASSIUM CHLORIDE ER 10 MEQ PO TBCR
10.0000 meq | EXTENDED_RELEASE_TABLET | Freq: Every day | ORAL | 0 refills | Status: DC
Start: 2020-10-20 — End: 2020-11-11

## 2020-10-20 MED ORDER — ATORVASTATIN CALCIUM 20 MG PO TABS
20.0000 mg | ORAL_TABLET | Freq: Every day | ORAL | 0 refills | Status: DC
Start: 2020-10-20 — End: 2020-11-11

## 2020-10-20 MED ORDER — ATORVASTATIN CALCIUM 20 MG PO TABS: 20.0000 mg | ORAL_TABLET | Freq: Every day | ORAL | 0 refills | Status: DC

## 2020-10-20 MED ORDER — PANTOPRAZOLE SODIUM 40 MG PO TBEC
40.0000 mg | DELAYED_RELEASE_TABLET | Freq: Every day | ORAL | 0 refills | Status: DC
Start: 2020-10-20 — End: 2020-10-20

## 2020-10-20 MED ORDER — FUROSEMIDE 20 MG PO TABS
20.0000 mg | ORAL_TABLET | Freq: Every day | ORAL | 0 refills | Status: DC
Start: 2020-10-20 — End: 2020-11-11

## 2020-10-20 MED ORDER — POTASSIUM CHLORIDE ER 10 MEQ PO TBCR
10.0000 meq | EXTENDED_RELEASE_TABLET | Freq: Every day | ORAL | 0 refills | Status: DC
Start: 2020-10-20 — End: 2020-10-20

## 2020-10-20 MED ORDER — LOSARTAN POTASSIUM 100 MG PO TABS
100.0000 mg | ORAL_TABLET | Freq: Every day | ORAL | 0 refills | Status: DC
Start: 2020-10-20 — End: 2020-11-11

## 2020-10-20 MED ORDER — CARVEDILOL 25 MG PO TABS: 25.0000 mg | ORAL_TABLET | Freq: Two times a day (BID) | ORAL | 0 refills | Status: DC

## 2020-10-20 MED ORDER — CARVEDILOL 25 MG PO TABS
25.0000 mg | ORAL_TABLET | Freq: Two times a day (BID) | ORAL | 0 refills | Status: DC
Start: 2020-10-20 — End: 2020-11-11

## 2020-10-20 MED ORDER — FUROSEMIDE 20 MG PO TABS: 20.0000 mg | ORAL_TABLET | Freq: Every day | ORAL | 0 refills | Status: DC

## 2020-10-20 MED FILL — FUROSEMIDE 20 MG TABS: 20 | 30 days supply | Qty: 30 | Fill #0

## 2020-10-20 MED FILL — LOSARTAN POTASSIUM 100 MG T: 100 | 30 days supply | Qty: 30 | Fill #0

## 2020-10-20 MED FILL — POTASSIUM CHLORIDE CRYS ER: 10 | 30 days supply | Qty: 30 | Fill #0

## 2020-10-20 MED FILL — PANTOPRAZOLE SOD DR 40 MG T: 40 | 30 days supply | Qty: 30 | Fill #0

## 2020-10-20 MED FILL — CARVEDILOL 25 MG TABLET: 25 | 30 days supply | Qty: 60 | Fill #0

## 2020-10-20 MED FILL — ATORVASTATIN 20 MG TABLET: 20 | 30 days supply | Qty: 30 | Fill #0

## 2020-10-20 NOTE — Addendum Note (Signed)
Addended by: Hinton Dyer on: 10/20/2020 05:07 PM   Modules accepted: Orders

## 2020-10-20 NOTE — Telephone Encounter (Signed)
*  STAT* If patient is at the pharmacy, call can be transferred to refill team.   1. Which medications need to be refilled? (please list name of each medication and dose if known)   atorvastatin (LIPITOR) 20 MG tablet losartan (COZAAR) 100 MG tablet carvedilol (COREG) 25 MG tablet furosemide (LASIX) 20 MG tablet potassium chloride (KLOR-CON) 10 MEQ tablet pantoprazole (PROTONIX) 40 MG tablet  2. Which pharmacy/location (including street and city if local pharmacy) is medication to be sent to? [facility] [place] - [address]  3. Do they need a 30 day or 90 day supply? 90 with refills

## 2020-11-10 ENCOUNTER — Telehealth: Payer: Self-pay | Admitting: Cardiovascular Disease

## 2020-11-10 NOTE — Telephone Encounter (Signed)
*  STAT* If patient is at the pharmacy, call can be transferred to refill team.   1. Which medications need to be refilled? (please list name of each medication and dose if known)  atorvastatin (LIPITOR) 20 MG tablet carvedilol (COREG) 25 MG tablet furosemide (LASIX) 20 MG tablet losartan (COZAAR) 100 MG tablet pantoprazole (PROTONIX) 40 MG tablet potassium chloride (KLOR-CON) 10 MEQ tablet  2. Which pharmacy/location (including street and city if local pharmacy) is medication to be sent to? [facility] [place] - [address]  3. Do they need a 30 day or 90 day supply? 90 with refills   Pt would like [place] to contact him once we send in the rx electronically. He does not want to drive down to [place] without knowing that the pharmacy has it ready

## 2020-11-11 ENCOUNTER — Other Ambulatory Visit: Payer: Self-pay

## 2020-11-11 ENCOUNTER — Ambulatory Visit (INDEPENDENT_AMBULATORY_CARE_PROVIDER_SITE_OTHER): Payer: Medicare Other | Admitting: Cardiovascular Disease

## 2020-11-11 ENCOUNTER — Encounter: Payer: Self-pay | Admitting: Cardiovascular Disease

## 2020-11-11 VITALS — BP 122/72 | HR 70 | Ht 72.0 in | Wt 190.6 lb

## 2020-11-11 DIAGNOSIS — I5022 Chronic systolic (congestive) heart failure: Secondary | ICD-10-CM | POA: Diagnosis not present

## 2020-11-11 DIAGNOSIS — Q8500 Neurofibromatosis, unspecified: Secondary | ICD-10-CM

## 2020-11-11 DIAGNOSIS — E782 Mixed hyperlipidemia: Secondary | ICD-10-CM

## 2020-11-11 DIAGNOSIS — I2510 Atherosclerotic heart disease of native coronary artery without angina pectoris: Secondary | ICD-10-CM | POA: Diagnosis not present

## 2020-11-11 LAB — HEPATIC FUNCTION PANEL
ALT: 40 IU/L (ref 0–44)
AST: 24 IU/L (ref 0–40)
Albumin: 4 g/dL (ref 3.7–4.7)
Alkaline Phosphatase: 95 IU/L (ref 44–121)
Bilirubin Total: 0.8 mg/dL (ref 0.0–1.2)
Bilirubin, Direct: 0.25 mg/dL (ref 0.00–0.40)
Total Protein: 6.6 g/dL (ref 6.0–8.5)

## 2020-11-11 LAB — BASIC METABOLIC PANEL
BUN/Creatinine Ratio: 18 (ref 10–24)
BUN: 17 mg/dL (ref 8–27)
CO2: 25 mmol/L (ref 20–29)
Calcium: 8.9 mg/dL (ref 8.6–10.2)
Chloride: 105 mmol/L (ref 96–106)
Creatinine, Ser: 0.95 mg/dL (ref 0.76–1.27)
GFR calc Af Amer: 93 mL/min/{1.73_m2} (ref >59)
GFR calc non Af Amer: 80 mL/min/{1.73_m2} (ref >59)
Glucose: 109 mg/dL — ABNORMAL HIGH (ref 65–99)
Potassium: 4.5 mmol/L (ref 3.5–5.2)
Sodium: 141 mmol/L (ref 134–144)

## 2020-11-11 LAB — LIPID PANEL
Chol/HDL Ratio: 2.1 ratio (ref 0.0–5.0)
Cholesterol, Total: 153 mg/dL (ref 100–199)
HDL: 72 mg/dL (ref >39)
LDL Chol Calc (NIH): 68 mg/dL (ref 0–99)
Triglycerides: 62 mg/dL (ref 0–149)
VLDL Cholesterol Cal: 13 mg/dL (ref 5–40)

## 2020-11-11 MED ORDER — POTASSIUM CHLORIDE CRYS ER 20 MEQ PO TBCR: 20.0000 meq | EXTENDED_RELEASE_TABLET | Freq: Every day | ORAL | 3 refills | Status: DC

## 2020-11-11 MED ORDER — PANTOPRAZOLE SODIUM 40 MG PO TBEC
40.0000 mg | DELAYED_RELEASE_TABLET | Freq: Every day | ORAL | 3 refills | Status: DC
Start: 2020-11-11 — End: 2021-11-23

## 2020-11-11 MED ORDER — ATORVASTATIN CALCIUM 20 MG PO TABS
20.0000 mg | ORAL_TABLET | Freq: Every day | ORAL | 3 refills | Status: DC
Start: 2020-11-11 — End: 2021-11-09

## 2020-11-11 MED ORDER — LOSARTAN POTASSIUM 100 MG PO TABS: 100.0000 mg | ORAL_TABLET | Freq: Every day | ORAL | 3 refills | Status: DC

## 2020-11-11 MED ORDER — CARVEDILOL 25 MG PO TABS: 25.0000 mg | ORAL_TABLET | Freq: Two times a day (BID) | ORAL | 3 refills | Status: DC

## 2020-11-11 MED ORDER — FUROSEMIDE 20 MG PO TABS: 20.0000 mg | ORAL_TABLET | Freq: Every day | ORAL | 3 refills | Status: DC

## 2020-11-11 NOTE — Telephone Encounter (Signed)
Pt's medication was sent to pt's pharmacy as requested. Confirmation received.  °

## 2020-11-11 NOTE — Progress Notes (Signed)
Cardiology Office Note   Date:  11/11/2020   ID:  HAIDYN CHADDERDON, DOB 09/28/1951, MRN [id number]  PCP:  Osie Cheeks, PA-C  Cardiologist:   Mertie Moores, MD   Chief Complaint  Patient presents with  . Congestive Heart Failure   1. Congestive heart failure-original ejection fraction equals 25-30% - improved to 50%. . minor coronary artery irregularities by heart catheterization 2. Neurofibromatosis 3. Hx of GI bleed    Gershon Mussel is a 69 year old gentleman with a history of congestive heart failure. Cardiac catheterization performed 12/01/2010 reveals mild coronary artery irregularities. His ejection fraction is around 50%. He's not had any episodes of chest pain or shortness of breath.  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   The cost of his Diovan will be increasing after the first of the year. He would like to change to losartan.  July 04, 2013:  Gershon Mussel is doing well. He has not had any problems  01/16/2014:  Gershon Mussel is doing well. 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   August 13, 2014:  Gershon Mussel is doing well. No CP or dyspnea. No PND or orthopnea. No DOE Does stay fatigued. Does not sleep well at night. He does not have a medical doctor    BILL YOHN is a 69 y.o. male who presents for follow up of his CHF  He remains quite active. He works as a Presenter, broadcasting at [employer]. He walks many miles throughout the course of his workday. He denies any chest pain or shortness breath.  08/21/2015:  Doing well.  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 No CP , no dyspnea.   February 25, 2016:  Gershon Mussel is doing  Well. 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  Is planning on retiring in June .   Wife  [name]) retired recently .  Oct. 9, 2017:  Gershon Mussel has retired since I last saw him Not  exercising as much   Breathing is good.   No CP   May 13, 2017  Gershon Mussel is seen today for follow up of his CHF Is enjoying retirement. Encouraged him to get some hobbies.  No CP or dyspnea.  Lipid profile looks great   Jan. 14, 2019:  Doing  Well.   Some exercise , doesn't do much since retiring from [employer].  No CP or dyspnea.    Sept. 4, 2019:  Gershon Mussel is seen today for follow of his congestive heart failure: No problems , Now goes to [facility] - Lupita Leash, MD  Was found to have an elevated PSA.   Has seen urology .   No plans for  Not walking as much as he should   March 08, 2019: Seen today for follow-up of his congestive heart failure.  He had an event monitor for some palpitations and was found to have some episodes of nonsustained ventricular tachycardia.  Echocardiogram reveals stable left ventricular systolic function with an ejection fraction of 45 to 50%.  [place] study showed no ischemia and mildly reduced left ventricular function.  He has not had any dizziness or CP ,  Breathing is good Is exercising 2-3 days a week .   Wears his wifes fitbit.   Tries to walk 10,000  steps a  October 02, 2019:   Gershon Mussel is seen today for follow-up of his congestive heart failure.  He has had some palpitations in the past has been found to have nonsustained ventricular tachycardia.  Last echocardiogram reveals an ejection fraction of 45 to 50%.  Lexiscan Myoview study has showed no evidence of ischemia.  VS look great  stays busy .  Walks several days a week   Nov. 23, 2021: Doing well Not exercising as much ,   No cp. No dyspnea  Past Medical History:  Diagnosis Date  . CHF (congestive heart failure) (HCC)    Ejection Fraction 25-30% // Echo 01/2019: EF 45-50, mild LVH, impaired diastolic relaxation, global HK  . History of GI bleed   . Neurofibromatosis   . SOB (shortness of breath)     Past Surgical History:  Procedure Laterality Date    . ESOPHAGOGASTRODUODENOSCOPY  08/14/2007   With epinepherine and bipolar electrocoagulation therapy  . OTHER SURGICAL HISTORY     Splenectomy     Current Outpatient Medications  Medication Sig Dispense Refill  . atorvastatin (LIPITOR) 20 MG tablet Take 1 tablet (20 mg total) by mouth daily. 30 tablet 0  . carvedilol (COREG) 25 MG tablet Take 1 tablet (25 mg total) by mouth 2 (two) times daily with a meal. 60 tablet 0  . ferrous gluconate (FERGON) 324 MG tablet Take 324 mg by mouth 2 (two) times daily with a meal.    . furosemide (LASIX) 20 MG tablet Take 1 tablet (20 mg total) by mouth daily. 30 tablet 0  . loratadine (CLARITIN) 10 MG tablet Take 10 mg by mouth daily.    Marland Kitchen losartan (COZAAR) 100 MG tablet Take 1 tablet (100 mg total) by mouth daily. 30 tablet 0  . Multiple Vitamins-Minerals (CENTRUM SILVER PO) Take 1 tablet by mouth daily.     . Omega-3 1000 MG CAPS Take 57 mg by mouth 2 (two) times daily.     . pantoprazole (PROTONIX) 40 MG tablet Take 1 tablet (40 mg total) by mouth daily. 30 tablet 0  . potassium chloride SA (KLOR-CON) 20 MEQ tablet Take 1 tablet by mouth daily.     No current facility-administered medications for this visit.    Allergies:   Sulfonamide derivatives    Social History:  The patient  reports that he has never smoked. He has never used smokeless tobacco. He reports that he does not drink alcohol and does not use drugs.   Family History:  The patient's family history includes Cancer in his mother; Stroke in his father.    ROS: Noted in current history.  Otherwise systems are negative.   Physical Exam: Blood pressure 122/72, pulse 70, height 6' (1.829 m), weight 190 lb 9.6 oz (86.5 kg), SpO2 96 %.  GEN:  Well nourished, well developed in no acute distress HEENT: Normal NECK: No JVD; No carotid bruits LYMPHATICS: No lymphadenopathy CARDIAC: RRR , no murmurs, rubs, gallops RESPIRATORY:  Clear to auscultation without rales, wheezing or rhonchi   ABDOMEN: Soft, non-tender, non-distended MUSCULOSKELETAL:  No edema; No deformity  SKIN: Warm and dry,  Neurofibromatosis  NEUROLOGIC:  Alert and oriented x 3  NEUROLOGIC:  Alert and oriented x 3   EKG:   November 11, 2020: Normal sinus rhythm with first-degree AV block.  Occasional PVC.  No ST or T wave changes.  Recent Labs: No results found for requested labs within last 8760 hours.    Lipid Panel  Component Value Date/Time   CHOL 152 04/25/2019 0920   TRIG 60 04/25/2019 0920   HDL 74 04/25/2019 0920   CHOLHDL 2.1 04/25/2019 0920   CHOLHDL 2.1 02/25/2016 0932   VLDL 12 02/25/2016 0932   LDLCALC 66 04/25/2019 0920      Wt Readings from Last 3 Encounters:  11/11/20 190 lb 9.6 oz (86.5 kg)  10/02/19 194 lb 12.8 oz (88.4 kg)  03/08/19 196 lb 12.8 oz (89.3 kg)      Other studies Reviewed: Additional studies/ records that were reviewed today include: . Review of the above records demonstrates:    ASSESSMENT AND PLAN:  1. Congestive heart failure-   he is not having any significant heart failure symptoms.  Continue current medications.  2. Neurofibromatosis  -he has progressive neurofibromatosis.  3. Hx of GI bleed -  No recurrent GI bleed.  4. Hyperlipidemia:    We will check lipid, liver enzymes, basic metabolic profile today.  If his numbers are satisfactory we will refill atorvastatin.   Current medicines are reviewed at length with the patient today.  The patient does not have concerns regarding medicines.  The following changes have been made:  no change   Disposition:   FU with me in 1 year  .    Signed, Mertie Moores, MD  11/11/2020 11:54 AM    [facility] [place], [place], [place]  [address] Phone: [phone]; Fax: [phone]

## 2020-11-11 NOTE — Patient Instructions (Signed)
Medication Instructions:  Your provider recommends that you continue on your current medications as directed. Please refer to the Current Medication list given to you today.   *If you need a refill on your cardiac medications before your next appointment, please call your pharmacy*  Lab Work: TODAY! Lipid, liver, BMET If you have labs (blood work) drawn today and your tests are completely normal, you will receive your results only by: . MyChart Message (if you have MyChart) OR . A paper copy in the mail If you have any lab test that is abnormal or we need to change your treatment, we will call you to review the results.  Follow-Up: At [facility], you and your health needs are our priority.  As part of our continuing mission to provide you with exceptional heart care, we have created designated Provider Care Teams.  These Care Teams include your primary Cardiologist (physician) and Advanced Practice Providers (APPs -  Physician Assistants and Nurse Practitioners) who all work together to provide you with the care you need, when you need it. Your next appointment:   12 month(s) The format for your next appointment:   In Person Provider:   You may see Philip Nahser, MD or one of the following Advanced Practice Providers on your designated Care Team:    Scott Weaver, PA-C  Vin Bhagat, PA-C   

## 2021-06-30 ENCOUNTER — Encounter: Payer: Self-pay | Admitting: Genetic Counselor

## 2021-06-30 DIAGNOSIS — Z1379 Encounter for other screening for genetic and chromosomal anomalies: Secondary | ICD-10-CM | POA: Insufficient documentation

## 2021-07-24 ENCOUNTER — Telehealth: Payer: Self-pay | Admitting: Genetic Counselor

## 2021-07-24 NOTE — Telephone Encounter (Signed)
LM on VM that results are back and to please call. 

## 2021-07-27 NOTE — Telephone Encounter (Signed)
LM on VM that results are back and to please call back. 

## 2021-07-28 ENCOUNTER — Telehealth: Payer: Self-pay | Admitting: Genetic Counselor

## 2021-07-28 NOTE — Telephone Encounter (Signed)
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  Discussed that we are trying to reclassify the VUS to pathogenic if possible.  He gave permission to pull a clinic note to submit to the lab in confirmation that he has NF1.  I pulled a note and submitted it to the lab.

## 2021-10-27 ENCOUNTER — Telehealth: Payer: Self-pay | Admitting: Genetic Counselor

## 2021-10-27 NOTE — Telephone Encounter (Signed)
LM on VM that an update was found on his previous genetic testing.  Please call for results.

## 2021-11-09 ENCOUNTER — Telehealth: Payer: Self-pay | Admitting: Cardiovascular Disease

## 2021-11-09 MED ORDER — POTASSIUM CHLORIDE CRYS ER 20 MEQ PO TBCR: 20.0000 meq | EXTENDED_RELEASE_TABLET | Freq: Every day | ORAL | 0 refills | Status: DC

## 2021-11-09 MED ORDER — CARVEDILOL 25 MG PO TABS: 25.0000 mg | ORAL_TABLET | Freq: Two times a day (BID) | ORAL | 0 refills | Status: DC

## 2021-11-09 MED ORDER — LOSARTAN POTASSIUM 100 MG PO TABS: 100.0000 mg | ORAL_TABLET | Freq: Every day | ORAL | 0 refills | Status: DC

## 2021-11-09 MED ORDER — ATORVASTATIN CALCIUM 20 MG PO TABS: 20.0000 mg | ORAL_TABLET | Freq: Every day | ORAL | 0 refills | Status: DC

## 2021-11-09 MED ORDER — FUROSEMIDE 20 MG PO TABS: 20.0000 mg | ORAL_TABLET | Freq: Every day | ORAL | 0 refills | Status: DC

## 2021-11-09 NOTE — Telephone Encounter (Signed)
Pt's medications were sent to pt's pharmacy as requested. Confirmation received.  

## 2021-11-09 NOTE — Telephone Encounter (Signed)
*  STAT* If patient is at the pharmacy, call can be transferred to refill team.   1. Which medications need to be refilled? (please list name of each medication and dose if known)  atorvastatin (LIPITOR) 20 MG tablet carvedilol (COREG) 25 MG tablet furosemide (LASIX) 20 MG tablet losartan (COZAAR) 100 MG tablet potassium chloride SA (KLOR-CON) 20 MEQ tablet  2. Which pharmacy/location (including street and city if local pharmacy) is medication to be sent to? [facility] - [place], [place] - [address]   3. Do they need a 30 day or 90 day supply? 90 day

## 2021-11-17 NOTE — Telephone Encounter (Signed)
LM on VM that we had some updated information on his previous genetic testing.

## 2021-11-23 ENCOUNTER — Other Ambulatory Visit: Payer: Self-pay

## 2021-11-23 MED ORDER — PANTOPRAZOLE SODIUM 40 MG PO TBEC: 40.0000 mg | DELAYED_RELEASE_TABLET | Freq: Every day | ORAL | 0 refills | Status: DC

## 2021-11-23 NOTE — Telephone Encounter (Signed)
Pt's medication was sent to pt's pharmacy as requested. Confirmation received.  °

## 2021-11-25 NOTE — Telephone Encounter (Signed)
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 This means that we have identified the genetic mutation that causes NF1 in his family. This will allow others who think they have NF1 to get tested and confirm it.  This result does not change his diagnosis.

## 2021-12-28 ENCOUNTER — Ambulatory Visit: Payer: Medicare Other | Admitting: Cardiovascular Disease

## 2022-01-15 ENCOUNTER — Ambulatory Visit: Payer: Medicare Other | Admitting: Cardiovascular Disease

## 2022-02-04 ENCOUNTER — Encounter: Payer: Self-pay | Admitting: Cardiovascular Disease

## 2022-02-04 NOTE — Progress Notes (Signed)
Cardiology Office Note   Date:  02/05/2022   ID:  Mike [name], DOB 04-Jan-1951, MRN [id number]  PCP:  Osie Cheeks, PA-C  Cardiologist:   Mertie Moores, MD   Chief Complaint  Patient presents with   Congestive Heart Failure        Hyperlipidemia   1. Congestive heart failure-original ejection fraction equals 25-30% - improved to 50%. . minor coronary artery irregularities by heart catheterization 2. Neurofibromatosis 3. Hx of GI bleed    Mike [name] is a 71 year old gentleman with a history of congestive heart failure. Cardiac catheterization performed 12/01/2010 reveals mild coronary artery irregularities. His ejection fraction is around 50%. He's not had any episodes of chest pain or shortness of breath.  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    The cost of his Diovan will be increasing after the first of the year. He would like to change to losartan.  July 04, 2013:  Mike [name] is doing well.  He has not had any problems  01/16/2014:  Mike [name] is doing well.  Still works Land at [employer]. He walks several miles a day in the course of his work.    August 13, 2014:  Mike [name] is doing well.  No CP or dyspnea.  No PND or orthopnea.  No DOE   Does stay fatigued.  Does not sleep well at night.   He does not have a medical doctor    Mike [name] is a 71 y.o. male who presents for follow up of his CHF  He remains quite active. He works as a Presenter, broadcasting at [employer]. He walks many miles throughout the course of his workday. He denies any chest pain or shortness breath.  08/21/2015:  Doing well.  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 No CP , no dyspnea.   February 25, 2016:  Mike [name] is doing  Well. 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  Is planning on retiring in June .   Wife  [name]) retired recently .  Oct. 9, 2017:  Mike [name] has retired since I  last saw him Not exercising as much   Breathing is good.   No CP   May 13, 2017  Mike [name] is seen today for follow up of his CHF Is enjoying retirement. Encouraged him to get some hobbies.  No CP or dyspnea.  Lipid profile looks great   Jan. 14, 2019:  Doing  Well.   Some exercise , doesn't do much since retiring from [employer].  No CP or dyspnea.    Sept. 4, 2019:  Mike [name] is seen today for follow of his congestive heart failure: No problems , Now goes to [facility] - Lupita Leash, MD  Was found to have an elevated PSA.   Has seen urology .   No plans for  Not walking as much as he should   March 08, 2019: Seen today for follow-up of his congestive heart failure.  He had an event monitor for some palpitations and was found to have some episodes of nonsustained ventricular tachycardia.  Echocardiogram reveals stable left ventricular systolic function with an ejection fraction of 45 to 50%.  [place] study showed no ischemia and mildly reduced left ventricular function.  He has not had any dizziness or CP ,  Breathing is  good Is exercising 2-3 days a week .   Wears his wifes fitbit.   Tries to walk 10,000 steps a  October 02, 2019:   Mike [name] is seen today for follow-up of his congestive heart failure.  He has had some palpitations in the past has been found to have nonsustained ventricular tachycardia.  Last echocardiogram reveals an ejection fraction of 45 to 50%.  Lexiscan Myoview study has showed no evidence of ischemia.  VS look great  stays busy .  Walks several days a week   Nov. 23, 2021: Doing well Not exercising as much ,   No cp. No dyspnea  Feb. 17, 2023: Mike [name] is seen for follow up of his CHF  Has DOE at times.   Not much exercise,  Encouraged him to exercise more   He had labs on December 20 with his primary medical doctor.  They are associated with the [facility] system.  His potassium is 4.2.  Glucose is 102.  Creatinine  0.98. Total cholesterol is 156.  HDL is 63.  LDL is 74. TSH was 0.89.  When I examined him today his heart rate was 100.  His EKG had indicated sinus rhythm at 70.  We attempted to get another EKG but by that time had already gone back down to 70.  Seems like he is having episodes of atrial tachycardia.  We will be placing a patch monitor for 14 days to explore this further.  We will also going to stop his carvedilol and start him on metoprolol XL 100 mg a day.  Past Medical History:  Diagnosis Date   CHF (congestive heart failure) (HCC)    Ejection Fraction 25-30% // Echo 01/2019: EF 45-50, mild LVH, impaired diastolic relaxation, global HK   History of GI bleed    Neurofibromatosis    SOB (shortness of breath)     Past Surgical History:  Procedure Laterality Date   ESOPHAGOGASTRODUODENOSCOPY  08/14/2007   With epinepherine and bipolar electrocoagulation therapy   OTHER SURGICAL HISTORY     Splenectomy     Current Outpatient Medications  Medication Sig Dispense Refill   atorvastatin (LIPITOR) 20 MG tablet Take 1 tablet (20 mg total) by mouth daily. 90 tablet 0   ferrous gluconate (FERGON) 324 MG tablet Take 324 mg by mouth 2 (two) times daily with a meal.     furosemide (LASIX) 20 MG tablet Take 1 tablet (20 mg total) by mouth daily. 90 tablet 0   loratadine (CLARITIN) 10 MG tablet Take 10 mg by mouth daily.     losartan (COZAAR) 100 MG tablet Take 1 tablet (100 mg total) by mouth daily. 90 tablet 0   Multiple Vitamins-Minerals (CENTRUM SILVER PO) Take 1 tablet by mouth daily.      Omega-3 1000 MG CAPS Take 57 mg by mouth 2 (two) times daily.      pantoprazole (PROTONIX) 40 MG tablet Take 1 tablet (40 mg total) by mouth daily. Please keep upcoming appt in January 2023 with Dr. Acie Fredrickson before anymore refills. Thank you 90 tablet 0   potassium chloride SA (KLOR-CON) 20 MEQ tablet Take 1 tablet (20 mEq total) by mouth daily. 90 tablet 0   No current facility-administered medications  for this visit.    Allergies:   Sulfonamide derivatives    Social History:  The patient  reports that he has never smoked. He has never used smokeless tobacco. He reports that he does not drink alcohol and does not use  drugs.   Family History:  The patient's family history includes Cancer in his mother; Stroke in his father.    ROS: Noted in current history.  Otherwise systems are negative.   Physical Exam: Blood pressure (!) 102/58, pulse 74, height 6' (1.829 m), weight 217 lb 9.6 oz (98.7 kg), SpO2 98 %.  GEN:  Well nourished, well developed in no acute distress HEENT: Normal NECK: No JVD; No carotid bruits LYMPHATICS: No lymphadenopathy CARDIAC: RRR , no murmurs, rubs, gallops RESPIRATORY:  Clear to auscultation without rales, wheezing or rhonchi  ABDOMEN: Soft, non-tender, non-distended MUSCULOSKELETAL:  No edema; No deformity  SKIN: Warm and dry NEUROLOGIC:  Alert and oriented x 3   EKG: Every 17, 2023: Normal sinus rhythm with first-degree AV block.  Left bundle branch block. .  Recent Labs: No results found for requested labs within last 8760 hours.    Lipid Panel    Component Value Date/Time   CHOL 153 11/11/2020 1213   TRIG 62 11/11/2020 1213   HDL 72 11/11/2020 1213   CHOLHDL 2.1 11/11/2020 1213   CHOLHDL 2.1 02/25/2016 0932   VLDL 12 02/25/2016 0932   LDLCALC 68 11/11/2020 1213      Wt Readings from Last 3 Encounters:  02/05/22 217 lb 9.6 oz (98.7 kg)  11/11/20 190 lb 9.6 oz (86.5 kg)  10/02/19 194 lb 12.8 oz (88.4 kg)      Other studies Reviewed: Additional studies/ records that were reviewed today include: . Review of the above records demonstrates:    ASSESSMENT AND PLAN:  1. Congestive heart failure-   Mike [name] is having worsening shortness of breath with exertion.  I would like to repeat his echocardiogram for further evaluation. He is having episodes of tachycardia.  I would like to stop his carvedilol and put him on Toprol-XL 100 mg a day.   We will place a 14-day patch monitor on him for further evaluation.    2. Neurofibromatosis  -his neurofibromatosis seems to be gradually and steadily worsening.  3. Hx of GI bleed -     4. Hyperlipidemia:    stable    Current medicines are reviewed at length with the patient today.  The patient does not have concerns regarding medicines.  The following changes have been made:  no change   Disposition:   Follow-up with an APP or me in 6 months.   Signed, Mertie Moores, MD  02/05/2022 10:41 AM    [facility] [place] [place], [place], [place]  [address] Phone: [phone]; Fax: [phone]

## 2022-02-05 ENCOUNTER — Other Ambulatory Visit: Payer: Self-pay

## 2022-02-05 ENCOUNTER — Ambulatory Visit (INDEPENDENT_AMBULATORY_CARE_PROVIDER_SITE_OTHER): Payer: Medicare Other | Admitting: Cardiovascular Disease

## 2022-02-05 ENCOUNTER — Encounter: Payer: Self-pay | Admitting: Cardiovascular Disease

## 2022-02-05 ENCOUNTER — Ambulatory Visit (INDEPENDENT_AMBULATORY_CARE_PROVIDER_SITE_OTHER): Payer: Medicare Other

## 2022-02-05 VITALS — BP 102/58 | HR 74 | Ht 72.0 in | Wt 217.6 lb

## 2022-02-05 DIAGNOSIS — R000 Tachycardia, unspecified: Secondary | ICD-10-CM | POA: Diagnosis not present

## 2022-02-05 DIAGNOSIS — R0602 Shortness of breath: Secondary | ICD-10-CM

## 2022-02-05 DIAGNOSIS — I5022 Chronic systolic (congestive) heart failure: Secondary | ICD-10-CM

## 2022-02-05 MED ORDER — ATORVASTATIN CALCIUM 20 MG PO TABS: 20.0000 mg | ORAL_TABLET | Freq: Every day | ORAL | 3 refills | Status: DC

## 2022-02-05 MED ORDER — FUROSEMIDE 20 MG PO TABS: 20.0000 mg | ORAL_TABLET | Freq: Every day | ORAL | 3 refills | Status: DC

## 2022-02-05 MED ORDER — POTASSIUM CHLORIDE CRYS ER 20 MEQ PO TBCR: 20.0000 meq | EXTENDED_RELEASE_TABLET | Freq: Every day | ORAL | 3 refills | Status: DC

## 2022-02-05 MED ORDER — METOPROLOL SUCCINATE ER 100 MG PO TB24: 100.0000 mg | ORAL_TABLET | Freq: Every day | ORAL | 3 refills | Status: DC

## 2022-02-05 MED ORDER — LOSARTAN POTASSIUM 100 MG PO TABS: 100.0000 mg | ORAL_TABLET | Freq: Every day | ORAL | 3 refills | Status: DC

## 2022-02-05 NOTE — Progress Notes (Unsigned)
Enrolled pt for 14 day Zio XT to be mailed to home address.

## 2022-02-05 NOTE — Patient Instructions (Signed)
Medication Instructions:  Your physician has recommended you make the following change in your medication: 1) STOP Coreg 2) START Metoprolol succinate (Toprol XL) 100mg  once daily  *If you need a refill on your cardiac medications before your next appointment, please call your pharmacy*   Lab Work: NONE If you have labs (blood work) drawn today and your tests are completely normal, you will receive your results only by: [place] (if you have MyChart) OR A paper copy in the mail If you have any lab test that is abnormal or we need to change your treatment, we will call you to review the results.   Testing/Procedures: Your physician has requested that you have an echocardiogram. Echocardiography is a painless test that uses sound waves to create images of your heart. It provides your doctor with information about the size and shape of your heart and how well your hearts chambers and valves are working. This procedure takes approximately one hour. There are no restrictions for this procedure.   Your physician has recommended you wear a heart monitor for 2 weeks.   Follow-Up: At [facility], you and your health needs are our priority.  As part of our continuing mission to provide you with exceptional heart care, we have created designated Provider Care Teams.  These Care Teams include your primary Cardiologist (physician) and Advanced Practice Providers (APPs -  Physician Assistants and Nurse Practitioners) who all work together to provide you with the care you need, when you need it.  We recommend signing up for the patient portal called "MyChart".  Sign up information is provided on this After Visit Summary.  MyChart is used to connect with patients for Virtual Visits (Telemedicine).  Patients are able to view lab/test results, encounter notes, upcoming appointments, etc.  Non-urgent messages can be sent to your provider as well.   To learn more about what you can do with MyChart,  go to [email or web address].    Your next appointment:   6 month(s)  The format for your next appointment:   In Person  Provider:   Mertie Moores, MD, or PA or NP    Other Instructions ZIO XT- Long Term Monitor Instructions  Your physician has requested you wear a ZIO patch monitor for 14 days.  This is a single patch monitor. [employer] supplies one patch monitor per enrollment. Additional stickers are not available. Please do not apply patch if you will be having a Nuclear Stress Test,  Echocardiogram, Cardiac CT, MRI, or Chest Xray during the period you would be wearing the  monitor. The patch cannot be worn during these tests. You cannot remove and re-apply the  ZIO XT patch monitor.  Your ZIO patch monitor will be mailed 3 day USPS to your address on file. It may take 3-5 days  to receive your monitor after you have been enrolled.  Once you have received your monitor, please review the enclosed instructions. Your monitor  has already been registered assigning a specific monitor serial # to you.  Billing and Patient Assistance Program Information  We have supplied [employer] with any of your insurance information on file for billing purposes. [employer] offers a sliding scale Patient Assistance Program for patients that do not have  insurance, or whose insurance does not completely cover the cost of the ZIO monitor.  You must apply for the Patient Assistance Program to qualify for this discounted rate.  To apply, please call [employer] at [phone], select option 4, select option 2, ask to  apply for  Patient Assistance Program. Theodore Demark will ask your household income, and how many people  are in your household. They will quote your out-of-pocket cost based on that information.  [employer] will also be able to set up a 36-month, interest-free payment plan if needed.  Applying the monitor   Shave hair from upper left chest.  Hold abrader disc by orange tab. Rub abrader in 40 strokes  over the upper left chest as  indicated in your monitor instructions.  Clean area with 4 enclosed alcohol pads. Let dry.  Apply patch as indicated in monitor instructions. Patch will be placed under collarbone on left  side of chest with arrow pointing upward.  Rub patch adhesive wings for 2 minutes. Remove white label marked "1". Remove the white  label marked "2". Rub patch adhesive wings for 2 additional minutes.  While looking in a mirror, press and release button in center of patch. A small green light will  flash 3-4 times. This will be your only indicator that the monitor has been turned on.  Do not shower for the first 24 hours. You may shower after the first 24 hours.  Press the button if you feel a symptom. You will hear a small click. Record Date, Time and  Symptom in the Patient Logbook.  When you are ready to remove the patch, follow instructions on the last 2 pages of Patient  Logbook. Stick patch monitor onto the last page of Patient Logbook.  Place Patient Logbook in the blue and white box. Use locking tab on box and tape box closed  securely. The blue and white box has prepaid postage on it. Please place it in the mailbox as  soon as possible. Your physician should have your test results approximately 7 days after the  monitor has been mailed back to [facility].  Call [facility] at [phone] if you have questions regarding  your ZIO XT patch monitor. Call them immediately if you see an orange light blinking on your  monitor.  If your monitor falls off in less than 4 days, contact our Monitor department at [phone].  If your monitor becomes loose or falls off after 4 days call [employer] at [phone] for  suggestions on securing your monitor

## 2022-02-10 DIAGNOSIS — R000 Tachycardia, unspecified: Secondary | ICD-10-CM | POA: Diagnosis not present

## 2022-02-10 DIAGNOSIS — I5022 Chronic systolic (congestive) heart failure: Secondary | ICD-10-CM

## 2022-02-10 DIAGNOSIS — R0602 Shortness of breath: Secondary | ICD-10-CM

## 2022-02-22 ENCOUNTER — Ambulatory Visit (HOSPITAL_COMMUNITY): Payer: Medicare Other | Attending: Internal Medicine

## 2022-02-22 ENCOUNTER — Other Ambulatory Visit: Payer: Self-pay

## 2022-02-22 DIAGNOSIS — R000 Tachycardia, unspecified: Secondary | ICD-10-CM | POA: Diagnosis present

## 2022-02-22 DIAGNOSIS — R0602 Shortness of breath: Secondary | ICD-10-CM | POA: Diagnosis present

## 2022-02-22 DIAGNOSIS — I5022 Chronic systolic (congestive) heart failure: Secondary | ICD-10-CM | POA: Diagnosis present

## 2022-02-22 LAB — ECHOCARDIOGRAM COMPLETE
Area-P 1/2: 2.55 cm2
P 1/2 time: 523 msec
S' Lateral: 3.9 cm

## 2022-03-02 ENCOUNTER — Telehealth: Payer: Self-pay | Admitting: Cardiovascular Disease

## 2022-03-02 MED ORDER — PANTOPRAZOLE SODIUM 40 MG PO TBEC: 40.0000 mg | DELAYED_RELEASE_TABLET | Freq: Every day | ORAL | 1 refills | Status: DC

## 2022-03-02 NOTE — Telephone Encounter (Signed)
Chart reviewed. ? ?Rx(s) sent to pharmacy electronically. ? ?Left message on wife's voicemail informing her that the rx has been sent to the pharmacy.  ? ? ? ?

## 2022-03-02 NOTE — Telephone Encounter (Signed)
?*  STAT* If patient is at the pharmacy, call can be transferred to refill team. ? ? ?1. Which medications need to be refilled? (please list name of each medication and dose if known)  ? ?pantoprazole (PROTONIX) 40 MG tablet ? ?2. Which pharmacy/location (including street and city if local pharmacy) is medication to be sent to? ?[facility] [place] [place] - [address] ? ?3. Do they need a 30 day or 90 day supply?  ?90 day supply ? ?

## 2022-03-08 ENCOUNTER — Telehealth: Payer: Self-pay

## 2022-03-08 MED ORDER — METOPROLOL SUCCINATE ER 100 MG PO TB24: 150.0000 mg | ORAL_TABLET | Freq: Every day | ORAL | 3 refills | Status: DC

## 2022-03-08 NOTE — Telephone Encounter (Signed)
-----   Message from Thayer Headings, MD sent at 03/07/2022  8:56 PM EDT ----- ?He has frequent episodes of SVT ?He is currently on toprol 100 mg a day  ?Lets increase the toprol to 150 mg a day  ? ?

## 2022-03-08 NOTE — Telephone Encounter (Signed)
Spoke with patient to discuss results of heart monitor. ? ?Increased Toprol to '150mg'$  daily per Dr. Acie Fredrickson. Sent to pharmacy of choice. ? ?Patient verbalized understanding and agrees with plan above. ?

## 2022-07-28 ENCOUNTER — Telehealth: Payer: Self-pay | Admitting: Cardiovascular Disease

## 2022-07-28 MED ORDER — LOSARTAN POTASSIUM 100 MG PO TABS: 100.0000 mg | ORAL_TABLET | Freq: Every day | ORAL | 1 refills | Status: DC

## 2022-07-28 MED ORDER — PANTOPRAZOLE SODIUM 40 MG PO TBEC: 40.0000 mg | DELAYED_RELEASE_TABLET | Freq: Every day | ORAL | 1 refills | Status: DC

## 2022-07-28 NOTE — Telephone Encounter (Signed)
*  STAT* If patient is at the pharmacy, call can be transferred to refill team.   1. Which medications need to be refilled? (please list name of each medication and dose if known)   losartan (COZAAR) 100 MG tablet   pantoprazole (PROTONIX) 40 MG tablet   2. Which pharmacy/location (including street and city if local pharmacy) is medication to be sent to? [facility] - [place], [place] - [address]   3. Do they need a 30 day or 90 day supply?  90 day supply

## 2022-07-28 NOTE — Telephone Encounter (Signed)
Pt's medications were sent to pt's pharmacy as requested. Confirmation received.  

## 2022-08-01 ENCOUNTER — Encounter: Payer: Self-pay | Admitting: Cardiovascular Disease

## 2022-08-01 NOTE — Progress Notes (Signed)
Cardiology Office Note   Date:  08/02/2022   ID:  [name], DOB [birth year], MRN [id number]  PCP:  Osie Cheeks, PA-C  Cardiologist:   Mertie Moores, MD   Chief Complaint  Patient presents with   Congestive Heart Failure        1. Congestive heart failure-original ejection fraction equals 25-30% - improved to 50%. . minor coronary artery irregularities by heart catheterization 2. Neurofibromatosis 3. Hx of GI bleed    Mike [name] is a 71 year old gentleman with a history of congestive heart failure. Cardiac catheterization performed 12/01/2010 reveals mild coronary artery irregularities. His ejection fraction is around 50%. He's not had any episodes of chest pain or shortness of breath.  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    The cost of his Diovan will be increasing after the first of the year. He would like to change to losartan.  July 04, 2013:  Mike [name] is doing well.  He has not had any problems  01/16/2014:  Mike [name] is doing well.  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    August 13, 2014:  Mike [name] is doing well.  No CP or dyspnea.  No PND or orthopnea.  No DOE   Does stay fatigued.  Does not sleep well at night.   He does not have a medical doctor    Mike [name] is a 71 y.o. male who presents for follow up of his CHF  He remains quite active. He works as a Presenter, broadcasting at [employer]. He walks many miles throughout the course of his workday. He denies any chest pain or shortness breath.  08/21/2015:  Doing well.  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 No CP , no dyspnea.   February 25, 2016:  Mike [name] is doing  Well. 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  Is planning on retiring in June .   Wife  Mike [name]) retired recently .  Oct. 9, 2017:  Mike [name] has retired since I last saw him Not  exercising as much   Breathing is good.   No CP   May 13, 2017  Mike [name] is seen today for follow up of his CHF Is enjoying retirement. Encouraged him to get some hobbies.  No CP or dyspnea.  Lipid profile looks great   Jan. 14, 2019:  Doing  Well.   Some exercise , doesn't do much since retiring from [employer].  No CP or dyspnea.    Sept. 4, 2019:  Mike [name] is seen today for follow of his congestive heart failure: No problems , Now goes to [facility] - Lupita Leash, MD  Was found to have an elevated PSA.   Has seen urology .   No plans for  Not walking as much as he should   March 08, 2019: Seen today for follow-up of his congestive heart failure.  He had an event monitor for some palpitations and was found to have some episodes of nonsustained ventricular tachycardia.  Echocardiogram reveals stable left ventricular systolic function with an ejection fraction of 45 to 50%.  [place] study showed no ischemia and mildly reduced left ventricular function.  He has not had any dizziness or CP ,  Breathing is good Is exercising  2-3 days a week .   Wears his wifes fitbit.   Tries to walk 10,000 steps a  October 02, 2019:   Mike [name] is seen today for follow-up of his congestive heart failure.  He has had some palpitations in the past has been found to have nonsustained ventricular tachycardia.  Last echocardiogram reveals an ejection fraction of 45 to 50%.  Lexiscan Myoview study has showed no evidence of ischemia.  VS look great  stays busy .  Walks several days a week   Nov. 23, 2021: Doing well Not exercising as much ,   No cp. No dyspnea  Feb. 17, 2023: Mike [name] is seen for follow up of his CHF  Has DOE at times.   Not much exercise,  Encouraged him to exercise more   He had labs on December 20 with his primary medical doctor.  They are associated with [facility].  His potassium is 4.2.  Glucose is 102.  Creatinine 0.98. Total  cholesterol is 156.  HDL is 63.  LDL is 74. TSH was 0.89.  When I examined him today his heart rate was 100.  His EKG had indicated sinus rhythm at 70.  We attempted to get another EKG but by that time had already gone back down to 70.  Seems like he is having episodes of atrial tachycardia.  We will be placing a patch monitor for 14 days to explore this further.  We will also going to stop his carvedilol and start him on metoprolol XL 100 mg a day.   Aug. 14, 2023: Mike [name] is seen for follow up of his CHF. He's been having more dyspnea His LV function is low/normal Moderate LVH, mild MR , mild AI   BP is good  HR is good  Breathing has improved     Past Medical History:  Diagnosis Date   CHF (congestive heart failure) (HCC)    Ejection Fraction 25-30% // Echo 01/2019: EF 45-50, mild LVH, impaired diastolic relaxation, global HK   History of GI bleed    Neurofibromatosis    SOB (shortness of breath)     Past Surgical History:  Procedure Laterality Date   ESOPHAGOGASTRODUODENOSCOPY  08/14/2007   With epinepherine and bipolar electrocoagulation therapy   OTHER SURGICAL HISTORY     Splenectomy     Current Outpatient Medications  Medication Sig Dispense Refill   atorvastatin (LIPITOR) 20 MG tablet Take 1 tablet (20 mg total) by mouth daily. 90 tablet 3   ferrous gluconate (FERGON) 324 MG tablet Take 324 mg by mouth 2 (two) times daily with a meal.     furosemide (LASIX) 20 MG tablet Take 1 tablet (20 mg total) by mouth daily. 90 tablet 3   loratadine (CLARITIN) 10 MG tablet Take 10 mg by mouth daily.     losartan (COZAAR) 100 MG tablet Take 1 tablet (100 mg total) by mouth daily. 90 tablet 1   metoprolol succinate (TOPROL-XL) 100 MG 24 hr tablet Take 1.5 tablets (150 mg total) by mouth daily. Take with or immediately following a meal. 135 tablet 3   Multiple Vitamins-Minerals (CENTRUM SILVER PO) Take 1 tablet by mouth daily.      Omega-3 1000 MG CAPS Take 57 mg by mouth 2 (two)  times daily.      pantoprazole (PROTONIX) 40 MG tablet Take 1 tablet (40 mg total) by mouth daily. 90 tablet 1   potassium chloride SA (KLOR-CON M) 20 MEQ tablet Take 1 tablet (  20 mEq total) by mouth daily. 90 tablet 3   No current facility-administered medications for this visit.    Allergies:   Sulfonamide derivatives    Social History:  The patient  reports that he has never smoked. He has never used smokeless tobacco. He reports that he does not drink alcohol and does not use drugs.   Family History:  The patient's family history includes Cancer in his mother; Stroke in his father.    ROS: Noted in current history.  Otherwise systems are negative.    Physical Exam: Blood pressure 108/60, pulse 70, height 6' (1.829 m), weight 220 lb (99.8 kg), SpO2 97 %.  GEN:  Well nourished, well developed in no acute distress HEENT: Normal NECK: No JVD; No carotid bruits LYMPHATICS: No lymphadenopathy CARDIAC: RRR , no murmurs, rubs, gallops RESPIRATORY:  Clear to auscultation without rales, wheezing or rhonchi  ABDOMEN: Soft, non-tender, non-distended MUSCULOSKELETAL:  No edema; No deformity  SKIN: Warm and dry NEUROLOGIC:  Alert and oriented x 3    EKG:  .  Recent Labs: No results found for requested labs within last 365 days.    Lipid Panel    Component Value Date/Time   CHOL 153 11/11/2020 1213   TRIG 62 11/11/2020 1213   HDL 72 11/11/2020 1213   CHOLHDL 2.1 11/11/2020 1213   CHOLHDL 2.1 02/25/2016 0932   VLDL 12 02/25/2016 0932   LDLCALC 68 11/11/2020 1213      Wt Readings from Last 3 Encounters:  08/02/22 220 lb (99.8 kg)  02/05/22 217 lb 9.6 oz (98.7 kg)  11/11/20 190 lb 9.6 oz (86.5 kg)      Other studies Reviewed: Additional studies/ records that were reviewed today include: . Review of the above records demonstrates:    ASSESSMENT AND PLAN:  1. Congestive heart failure-    seems to be stable .  Breathing has improved.    2. Neurofibromatosis -  progressive ,    3. Hx of GI bleed - no recurrent bleeding    4. Hyperlipidemia:    check lipids  today    Current medicines are reviewed at length with the patient today.  The patient does not have concerns regarding medicines.  The following changes have been made:  no change   Disposition:  follow up in 1 year    Signed, Mertie Moores, MD  08/02/2022 10:13 AM    [address], [place], [place]  [address] Phone: [phone]; Fax: [phone]

## 2022-08-02 ENCOUNTER — Encounter: Payer: Self-pay | Admitting: Cardiovascular Disease

## 2022-08-02 ENCOUNTER — Ambulatory Visit (INDEPENDENT_AMBULATORY_CARE_PROVIDER_SITE_OTHER): Payer: Medicare Other | Admitting: Cardiovascular Disease

## 2022-08-02 VITALS — BP 108/60 | HR 70 | Ht 72.0 in | Wt 220.0 lb

## 2022-08-02 DIAGNOSIS — I5022 Chronic systolic (congestive) heart failure: Secondary | ICD-10-CM

## 2022-08-02 DIAGNOSIS — I2510 Atherosclerotic heart disease of native coronary artery without angina pectoris: Secondary | ICD-10-CM

## 2022-08-02 DIAGNOSIS — E782 Mixed hyperlipidemia: Secondary | ICD-10-CM

## 2022-08-02 LAB — LIPID PANEL
Chol/HDL Ratio: 2.3 ratio (ref 0.0–5.0)
Cholesterol, Total: 151 mg/dL (ref 100–199)
HDL: 67 mg/dL (ref >39)
LDL Chol Calc (NIH): 71 mg/dL (ref 0–99)
Triglycerides: 67 mg/dL (ref 0–149)
VLDL Cholesterol Cal: 13 mg/dL (ref 5–40)

## 2022-08-02 LAB — BASIC METABOLIC PANEL
BUN/Creatinine Ratio: 15 (ref 10–24)
BUN: 14 mg/dL (ref 8–27)
CO2: 23 mmol/L (ref 20–29)
Calcium: 9 mg/dL (ref 8.6–10.2)
Chloride: 104 mmol/L (ref 96–106)
Creatinine, Ser: 0.93 mg/dL (ref 0.76–1.27)
Glucose: 100 mg/dL — ABNORMAL HIGH (ref 70–99)
Potassium: 4.6 mmol/L (ref 3.5–5.2)
Sodium: 138 mmol/L (ref 134–144)
eGFR: 87 mL/min/{1.73_m2} (ref >59)

## 2022-08-02 LAB — ALT: ALT: 29 IU/L (ref 0–44)

## 2022-08-02 NOTE — Patient Instructions (Signed)
Medication Instructions:  Your physician recommends that you continue on your current medications as directed. Please refer to the Current Medication list given to you today.  *If you need a refill on your cardiac medications before your next appointment, please call your pharmacy*   Lab Work: LIPIDS, ALT, BMET Today If you have labs (blood work) drawn today and your tests are completely normal, you will receive your results only by: [place] (if you have MyChart) OR A paper copy in the mail If you have any lab test that is abnormal or we need to change your treatment, we will call you to review the results.   Testing/Procedures: NONE   Follow-Up: At [facility], you and your health needs are our priority.  As part of our continuing mission to provide you with exceptional heart care, we have created designated Provider Care Teams.  These Care Teams include your primary Cardiologist (physician) and Advanced Practice Providers (APPs -  Physician Assistants and Nurse Practitioners) who all work together to provide you with the care you need, when you need it.  Your next appointment:   1 year(s)  The format for your next appointment:   In Person  Provider:   Mertie Moores, MD      Important Information About Sugar

## 2022-08-06 ENCOUNTER — Ambulatory Visit: Payer: Medicare Other | Admitting: Cardiovascular Disease

## 2023-09-20 DEATH — deceased

## 2024-09-05 ENCOUNTER — Encounter: Admitting: Urology
